# Patient Record
Sex: Female | Born: 1955 | Race: White | Hispanic: No | Marital: Married | Smoking: Former smoker
Health system: Southern US, Community
[De-identification: ages and names within clinical notes are randomized; demographics above are authoritative.]

## PROBLEM LIST (undated history)

## (undated) DIAGNOSIS — I10 Essential (primary) hypertension: Secondary | ICD-10-CM

## (undated) DIAGNOSIS — R0609 Other forms of dyspnea: Secondary | ICD-10-CM

## (undated) DIAGNOSIS — I739 Peripheral vascular disease, unspecified: Secondary | ICD-10-CM

## (undated) DIAGNOSIS — J449 Chronic obstructive pulmonary disease, unspecified: Secondary | ICD-10-CM

## (undated) HISTORY — DX: Chronic obstructive pulmonary disease, unspecified: J44.9

## (undated) HISTORY — DX: Peripheral vascular disease, unspecified: I73.9

## (undated) HISTORY — DX: Other forms of dyspnea: R06.09

## (undated) HISTORY — PX: ABDOMINAL HYSTERECTOMY: SHX81

---

## 2003-04-08 ENCOUNTER — Ambulatory Visit (HOSPITAL_COMMUNITY): Admission: RE | Admit: 2003-04-08 | Discharge: 2003-04-08 | Payer: Self-pay | Admitting: Family Medicine

## 2003-07-22 ENCOUNTER — Ambulatory Visit (HOSPITAL_BASED_OUTPATIENT_CLINIC_OR_DEPARTMENT_OTHER): Admission: RE | Admit: 2003-07-22 | Discharge: 2003-07-22 | Payer: Self-pay | Admitting: Urology

## 2007-09-01 ENCOUNTER — Ambulatory Visit (HOSPITAL_COMMUNITY): Admission: RE | Admit: 2007-09-01 | Discharge: 2007-09-01 | Payer: Self-pay | Admitting: Internal Medicine

## 2007-09-08 ENCOUNTER — Ambulatory Visit (HOSPITAL_COMMUNITY): Admission: RE | Admit: 2007-09-08 | Discharge: 2007-09-08 | Payer: Self-pay | Admitting: Internal Medicine

## 2010-07-13 NOTE — Op Note (Signed)
NAME:  Brenda Goodwin, Brenda Goodwin                           ACCOUNT NO.:  [id number]   MEDICAL RECORD NO.:  [id number]                   PATIENT TYPE:  AMB   LOCATION:  [facility]                                 FACILITY:  [facility]   PHYSICIAN:  Ronald L. Earlene Plater, M.D.               DATE OF BIRTH:  1956/02/08   DATE OF PROCEDURE:  07/22/2003  DATE OF DISCHARGE:                                 OPERATIVE REPORT   PREOPERATIVE DIAGNOSIS:  Stress urinary incontinence.   POSTOPERATIVE DIAGNOSIS:  Stress urinary incontinence.   PROCEDURE:  1. Cystoscopy.  2. Transvaginal tape sling (TVT).   SURGEON:  Darvin Neighbours, MD   RESIDENT SURGEON:  Rhae Lerner, MD   ANESTHESIA:  General endotracheal anesthesia.   COMPLICATIONS:  None.   INDICATIONS FOR PROCEDURE:  [name] is a 55 year old female with a history  of stress urinary incontinence, demonstrated by her history as well as  urodynamic studies.  After discussing the alternatives for treatment of her  incontinence, the patient has elected to undergo TVT.   DESCRIPTION OF THE PROCEDURE IN DETAIL:  The patient was brought to the  operating room and following induction of anesthesia, was placed in the  dorsal lithotomy position and prepped and draped in the usual sterile  fashion.  A Foley catheter was subsequently placed to straight drainage.  Marcaine 0.25% 7 mL were injected superficially into the anterior vaginal  wall.  A midline incision approximately 2 cm in length was then made on the  anterior vaginal wall midway between the urethral meatus and the  ureterovesical junction.  Dissection was carried out to develop a plane  bilaterally between the anterior vaginal wall and the periurethral and  pubocervical fascia.  The dissection was carried laterally all the way to  the lateral edge of the pubocervical fascia, just posterior to the pubic  bone.  Once this had been accomplished, a small incision was made  superolaterally on the left and right  to the pubic symphysis.  A guide was  subsequently passed down along the pubic bone on either side through these  incisions and brought through the fascia immediately lateral to the  ureterovesical junction on either side.  A rigid cystoscope was subsequently  passed into the patient's bladder after removing the Foley catheter to check  for bladder injury, and examination of the bladder revealed no perforation.  The guides were subsequently used to pass a transvaginal tape sling up  through the endopelvic fascia and into position.  Correct positioning of the  sling was confirmed by passing a hemostat between the sling and the urethra.  A second inspection of the bladder mucosa was performed, again revealing no  injury.  The transvaginal tape sling was subsequently deployed and its  correct position again checked and confirmed.  A final inspection was made  of the bladder mucosa using the rigid cystoscope, again revealing that the  bladder mucosa was intact with no signs of any injury.  The bladder was  subsequently filled and the cystoscope removed.  Suprapubic pressure was  subsequently applied to confirm that fluid could pass through the urethra.  The bladder was subsequently drained and attention returned to the vaginal  incision.  The incision was closed with a running 2-0 Vicryl suture.  The  sling was subsequently trimmed suprapubically, and the small suprapubic  incisions  closed with Dermabond.  Vaginal packing was placed, and the case was ended.  The patient tolerated the procedure well; there were no complications.  Please note that Dr. Darvin Neighbours was present for the entire case and  participated in all aspects of the procedure.     Bailey Mech, MD                        Lucrezia Starch. Earlene Plater, M.D.    JP/MEDQ  D:  07/22/2003  T:  07/23/2003  Job:  [id number]

## 2018-05-18 ENCOUNTER — Observation Stay (HOSPITAL_BASED_OUTPATIENT_CLINIC_OR_DEPARTMENT_OTHER): Payer: Self-pay

## 2018-05-18 ENCOUNTER — Encounter: Payer: Self-pay | Admitting: Emergency Medicine

## 2018-05-18 ENCOUNTER — Emergency Department (HOSPITAL_COMMUNITY): Payer: Self-pay

## 2018-05-18 ENCOUNTER — Observation Stay (HOSPITAL_COMMUNITY): Payer: Self-pay

## 2018-05-18 ENCOUNTER — Inpatient Hospital Stay (HOSPITAL_COMMUNITY)
Admission: EM | Admit: 2018-05-18 | Discharge: 2018-05-20 | DRG: 280 | Disposition: A | Discharge status: Home / Self Care | Payer: Self-pay | Attending: Internal Medicine | Admitting: Internal Medicine

## 2018-05-18 ENCOUNTER — Other Ambulatory Visit: Payer: Self-pay

## 2018-05-18 DIAGNOSIS — M7989 Other specified soft tissue disorders: Secondary | ICD-10-CM

## 2018-05-18 DIAGNOSIS — Z79899 Other long term (current) drug therapy: Secondary | ICD-10-CM

## 2018-05-18 DIAGNOSIS — R42 Dizziness and giddiness: Secondary | ICD-10-CM | POA: Diagnosis present

## 2018-05-18 DIAGNOSIS — R0981 Nasal congestion: Secondary | ICD-10-CM

## 2018-05-18 DIAGNOSIS — Z791 Long term (current) use of non-steroidal anti-inflammatories (NSAID): Secondary | ICD-10-CM

## 2018-05-18 DIAGNOSIS — R634 Abnormal weight loss: Secondary | ICD-10-CM | POA: Diagnosis present

## 2018-05-18 DIAGNOSIS — Z90710 Acquired absence of both cervix and uterus: Secondary | ICD-10-CM

## 2018-05-18 DIAGNOSIS — Z833 Family history of diabetes mellitus: Secondary | ICD-10-CM

## 2018-05-18 DIAGNOSIS — R55 Syncope and collapse: Secondary | ICD-10-CM

## 2018-05-18 DIAGNOSIS — I2510 Atherosclerotic heart disease of native coronary artery without angina pectoris: Secondary | ICD-10-CM

## 2018-05-18 DIAGNOSIS — J189 Pneumonia, unspecified organism: Secondary | ICD-10-CM | POA: Diagnosis present

## 2018-05-18 DIAGNOSIS — E872 Acidosis: Secondary | ICD-10-CM | POA: Diagnosis present

## 2018-05-18 DIAGNOSIS — R05 Cough: Secondary | ICD-10-CM

## 2018-05-18 DIAGNOSIS — R7303 Prediabetes: Secondary | ICD-10-CM | POA: Diagnosis present

## 2018-05-18 DIAGNOSIS — R32 Unspecified urinary incontinence: Secondary | ICD-10-CM

## 2018-05-18 DIAGNOSIS — Z825 Family history of asthma and other chronic lower respiratory diseases: Secondary | ICD-10-CM

## 2018-05-18 DIAGNOSIS — E876 Hypokalemia: Secondary | ICD-10-CM | POA: Diagnosis present

## 2018-05-18 DIAGNOSIS — R0902 Hypoxemia: Secondary | ICD-10-CM | POA: Diagnosis present

## 2018-05-18 DIAGNOSIS — R918 Other nonspecific abnormal finding of lung field: Secondary | ICD-10-CM

## 2018-05-18 DIAGNOSIS — J181 Lobar pneumonia, unspecified organism: Secondary | ICD-10-CM

## 2018-05-18 DIAGNOSIS — I214 Non-ST elevation (NSTEMI) myocardial infarction: Principal | ICD-10-CM | POA: Diagnosis present

## 2018-05-18 DIAGNOSIS — W19XXXA Unspecified fall, initial encounter: Secondary | ICD-10-CM

## 2018-05-18 DIAGNOSIS — R7989 Other specified abnormal findings of blood chemistry: Secondary | ICD-10-CM

## 2018-05-18 DIAGNOSIS — I10 Essential (primary) hypertension: Secondary | ICD-10-CM | POA: Diagnosis present

## 2018-05-18 DIAGNOSIS — F17210 Nicotine dependence, cigarettes, uncomplicated: Secondary | ICD-10-CM | POA: Diagnosis present

## 2018-05-18 DIAGNOSIS — E785 Hyperlipidemia, unspecified: Secondary | ICD-10-CM | POA: Diagnosis present

## 2018-05-18 DIAGNOSIS — J440 Chronic obstructive pulmonary disease with acute lower respiratory infection: Secondary | ICD-10-CM | POA: Diagnosis present

## 2018-05-18 HISTORY — DX: Essential (primary) hypertension: I10

## 2018-05-18 LAB — CBC WITH DIFFERENTIAL/PLATELET
Abs Immature Granulocytes: 0.09 10*3/uL — ABNORMAL HIGH (ref 0.00–0.07)
BASOS PCT: 1 %
Basophils Absolute: 0.1 10*3/uL (ref 0.0–0.1)
EOS ABS: 0.2 10*3/uL (ref 0.0–0.5)
Eosinophils Relative: 2 %
HCT: 42.7 % (ref 36.0–46.0)
Hemoglobin: 13.9 g/dL (ref 12.0–15.0)
Immature Granulocytes: 1 %
Lymphocytes Relative: 24 %
Lymphs Abs: 3.1 10*3/uL (ref 0.7–4.0)
MCH: 27.8 pg (ref 26.0–34.0)
MCHC: 32.6 g/dL (ref 30.0–36.0)
MCV: 85.4 fL (ref 80.0–100.0)
MONOS PCT: 4 %
Monocytes Absolute: 0.5 10*3/uL (ref 0.1–1.0)
NEUTROS PCT: 68 %
Neutro Abs: 8.9 10*3/uL — ABNORMAL HIGH (ref 1.7–7.7)
Platelets: 202 10*3/uL (ref 150–400)
RBC: 5 MIL/uL (ref 3.87–5.11)
RDW: 12 % (ref 11.5–15.5)
WBC: 12.8 10*3/uL — ABNORMAL HIGH (ref 4.0–10.5)
nRBC: 0 % (ref 0.0–0.2)

## 2018-05-18 LAB — ECHOCARDIOGRAM COMPLETE
Height: 67 in
Weight: 2720 oz

## 2018-05-18 LAB — COMPREHENSIVE METABOLIC PANEL
ALT: 16 U/L (ref 0–44)
ANION GAP: 15 (ref 5–15)
AST: 32 U/L (ref 15–41)
Albumin: 3.5 g/dL (ref 3.5–5.0)
Alkaline Phosphatase: 76 U/L (ref 38–126)
BUN: 12 mg/dL (ref 8–23)
CO2: 18 mmol/L — ABNORMAL LOW (ref 22–32)
Calcium: 9.1 mg/dL (ref 8.9–10.3)
Chloride: 104 mmol/L (ref 98–111)
Creatinine, Ser: 1.2 mg/dL — ABNORMAL HIGH (ref 0.44–1.00)
GFR, EST AFRICAN AMERICAN: 56 mL/min — AB (ref >60)
GFR, EST NON AFRICAN AMERICAN: 48 mL/min — AB (ref >60)
Glucose, Bld: 151 mg/dL — ABNORMAL HIGH (ref 70–99)
POTASSIUM: 3.2 mmol/L — AB (ref 3.5–5.1)
Sodium: 137 mmol/L (ref 135–145)
Total Bilirubin: 1.6 mg/dL — ABNORMAL HIGH (ref 0.3–1.2)
Total Protein: 6.5 g/dL (ref 6.5–8.1)

## 2018-05-18 LAB — TROPONIN I: TROPONIN I: 0.04 ng/mL — AB (ref <0.03)

## 2018-05-18 LAB — HEMOGLOBIN A1C
Hgb A1c MFr Bld: 5.9 % — ABNORMAL HIGH (ref 4.8–5.6)
Mean Plasma Glucose: 122.63 mg/dL

## 2018-05-18 LAB — LIPID PANEL
Cholesterol: 165 mg/dL (ref 0–200)
HDL: 34 mg/dL — ABNORMAL LOW (ref >40)
LDL CALC: 109 mg/dL — AB (ref 0–99)
Total CHOL/HDL Ratio: 4.9 RATIO
Triglycerides: 108 mg/dL (ref <150)
VLDL: 22 mg/dL (ref 0–40)

## 2018-05-18 LAB — MAGNESIUM: Magnesium: 1.5 mg/dL — ABNORMAL LOW (ref 1.7–2.4)

## 2018-05-18 LAB — TSH: TSH: 1.665 u[IU]/mL (ref 0.350–4.500)

## 2018-05-18 MED ORDER — ASPIRIN EC 81 MG PO TBEC
81.0000 mg | DELAYED_RELEASE_TABLET | Freq: Every day | ORAL | Status: DC
  Administered 2018-05-18 – 2018-05-20 (×3): 81 mg via ORAL
  Filled 2018-05-18 (×3): qty 1

## 2018-05-18 MED ORDER — ACETAMINOPHEN 650 MG RE SUPP: 650.0000 mg | Freq: Four times a day (QID) | RECTAL | Status: DC | PRN

## 2018-05-18 MED ORDER — POTASSIUM CHLORIDE CRYS ER 20 MEQ PO TBCR
30.0000 meq | EXTENDED_RELEASE_TABLET | ORAL | Status: AC
  Administered 2018-05-18 (×2): 30 meq via ORAL
  Filled 2018-05-18 (×2): qty 1

## 2018-05-18 MED ORDER — SODIUM CHLORIDE 0.9 % IV SOLN
INTRAVENOUS | Status: DC
  Administered 2018-05-18: 10:00:00 via INTRAVENOUS

## 2018-05-18 MED ORDER — SODIUM CHLORIDE 0.9 % IV SOLN
1.0000 g | Freq: Once | INTRAVENOUS | Status: AC
  Administered 2018-05-18: 1 g via INTRAVENOUS
  Filled 2018-05-18: qty 10

## 2018-05-18 MED ORDER — SODIUM CHLORIDE 0.9% FLUSH
3.0000 mL | Freq: Two times a day (BID) | INTRAVENOUS | Status: DC
  Administered 2018-05-18: 3 mL via INTRAVENOUS

## 2018-05-18 MED ORDER — SODIUM CHLORIDE 0.9 % IV BOLUS
500.0000 mL | Freq: Once | INTRAVENOUS | Status: AC
  Administered 2018-05-18: 500 mL via INTRAVENOUS

## 2018-05-18 MED ORDER — ONDANSETRON HCL 4 MG PO TABS
4.0000 mg | ORAL_TABLET | Freq: Four times a day (QID) | ORAL | Status: DC | PRN
  Administered 2018-05-20: 4 mg via ORAL
  Filled 2018-05-18: qty 1

## 2018-05-18 MED ORDER — ONDANSETRON HCL 4 MG/2ML IJ SOLN: 4.0000 mg | Freq: Four times a day (QID) | INTRAMUSCULAR | Status: DC | PRN

## 2018-05-18 MED ORDER — ACETAMINOPHEN 325 MG PO TABS
650.0000 mg | ORAL_TABLET | Freq: Four times a day (QID) | ORAL | Status: DC | PRN
  Administered 2018-05-20: 650 mg via ORAL
  Filled 2018-05-18: qty 2

## 2018-05-18 MED ORDER — ENOXAPARIN SODIUM 40 MG/0.4ML ~~LOC~~ SOLN
40.0000 mg | Freq: Every day | SUBCUTANEOUS | Status: DC
  Administered 2018-05-18 – 2018-05-19 (×2): 40 mg via SUBCUTANEOUS
  Filled 2018-05-18 (×2): qty 0.4

## 2018-05-18 MED ORDER — SODIUM CHLORIDE 0.9 % IV SOLN
500.0000 mg | Freq: Once | INTRAVENOUS | Status: AC
  Administered 2018-05-18: 500 mg via INTRAVENOUS
  Filled 2018-05-18: qty 500

## 2018-05-18 MED ORDER — POLYETHYLENE GLYCOL 3350 17 G PO PACK: 17.0000 g | PACK | Freq: Every day | ORAL | Status: DC | PRN

## 2018-05-18 NOTE — Progress Notes (Signed)
  Echocardiogram 2D Echocardiogram has been performed.  Brenda Goodwin 05/18/2018, 12:52 PM

## 2018-05-18 NOTE — ED Provider Notes (Addendum)
[facility] EMERGENCY DEPARTMENT Provider Note   CSN: [id number] Arrival date & time: 05/18/18  3748    History   Chief Complaint No chief complaint on file.   HPI Brenda Goodwin is a 63 y.o. female.     Patient brought in by EMS.  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  Felt dizzy and passed out hit head on the grill.  No obvious injury.  Patient alert and oriented.  Patient states she felt fine this morning felt fine yesterday.  She has had a dry cough for a period of time.  No recent respiratory symptoms.  No fevers.  No known coronavirus contacts.  Patient does still occasionally cough up some yellow phlegm.  Patient was on some antibiotics back in December.  No recent admissions to the hospital.  According to EMS patient vomited 3-4 times.  EMS did give her Zofran.  Afebrile upon arrival.  Shortly after arrival here patient dropped her oxygen saturations down into the 80s on room air and was placed on 3 L.  Patient sats went up to around 97 on that.  Patient oxygen sent decreased down to 2 L.  Patient satting in the 90s.  Patient denies any pain or chest pain.  No history of syncope.     Past Medical History:  Diagnosis Date  . Hypertension     There are no active problems to display for this patient.   Past Surgical History:  Procedure Laterality Date  . ABDOMINAL HYSTERECTOMY       OB History   No obstetric history on file.      Home Medications    Prior to Admission medications   Not on File    Family History History reviewed. No pertinent family history.  Social History Social History   Tobacco Use  . Smoking status: Current Every Day Smoker    Packs/day: 1.00    Types: Cigarettes  . Smokeless tobacco: Never Used  Substance Use Topics  . Alcohol use: Not Currently  . Drug use: Never     Allergies   Patient has no known allergies.   Review of Systems Review of Systems  Constitutional: Negative for chills and fever.  HENT:  Negative for congestion, rhinorrhea and sore throat.   Eyes: Negative for visual disturbance.  Respiratory: Positive for cough. Negative for shortness of breath.   Cardiovascular: Negative for chest pain and leg swelling.  Gastrointestinal: Positive for nausea and vomiting. Negative for abdominal pain and diarrhea.  Genitourinary: Negative for dysuria.  Musculoskeletal: Negative for back pain and neck pain.  Skin: Negative for rash.  Neurological: Positive for dizziness and syncope. Negative for light-headedness and headaches.  Hematological: Does not bruise/bleed easily.  Psychiatric/Behavioral: Negative for confusion.     Physical Exam Updated Vital Signs BP 137/68   Pulse 70   Temp 97.8 F (36.6 C) (Oral)   Resp 13   Ht 1.702 m (5\' 7" )   Wt 77.1 kg   SpO2 (!) 87%   BMI 26.63 kg/m   Physical Exam Vitals signs and nursing note reviewed.  Constitutional:      General: She is not in acute distress.    Appearance: Normal appearance. She is well-developed.  HENT:     Head: Normocephalic and atraumatic.     Nose: No congestion.     Mouth/Throat:     Mouth: Mucous membranes are moist.  Eyes:     Extraocular Movements: Extraocular movements intact.  Conjunctiva/sclera: Conjunctivae normal.     Pupils: Pupils are equal, round, and reactive to light.  Neck:     Musculoskeletal: Normal range of motion and neck supple.  Cardiovascular:     Rate and Rhythm: Normal rate and regular rhythm.     Heart sounds: No murmur.  Pulmonary:     Effort: Pulmonary effort is normal. No respiratory distress.     Breath sounds: Normal breath sounds. No wheezing, rhonchi or rales.  Chest:     Chest wall: No tenderness.  Abdominal:     Palpations: Abdomen is soft.     Tenderness: There is no abdominal tenderness.  Musculoskeletal: Normal range of motion.  Skin:    General: Skin is warm and dry.     Capillary Refill: Capillary refill takes less than 2 seconds.  Neurological:      General: No focal deficit present.     Mental Status: She is alert and oriented to person, place, and time.      ED Treatments / Results  Labs (all labs ordered are listed, but only abnormal results are displayed) Labs Reviewed  CBC WITH DIFFERENTIAL/PLATELET - Abnormal; Notable for the following components:      Result Value   WBC 12.8 (*)    Neutro Abs 8.9 (*)    Abs Immature Granulocytes 0.09 (*)    All other components within normal limits  COMPREHENSIVE METABOLIC PANEL - Abnormal; Notable for the following components:   Potassium 3.2 (*)    CO2 18 (*)    Glucose, Bld 151 (*)    Creatinine, Ser 1.20 (*)    Total Bilirubin 1.6 (*)    GFR calc non Af Amer 48 (*)    GFR calc Af Amer 56 (*)    All other components within normal limits    EKG EKG Interpretation  Date/Time:  Monday May 18 2018 08:03:49 EDT Ventricular Rate:  72 PR Interval:    QRS Duration: 112 QT Interval:  377 QTC Calculation: 413 R Axis:   14 Text Interpretation:  Sinus rhythm Short PR interval Borderline intraventricular conduction delay Low voltage, precordial leads Borderline repolarization abnormality No previous ECGs available Artifact Confirmed by Vanetta Mulders [phone]) on 05/18/2018 8:20:59 AM   Radiology Dg Chest 2 View  Result Date: 05/18/2018 CLINICAL DATA:  Productive cough for 2 months EXAM: CHEST - 2 VIEW COMPARISON:  09/01/2007 FINDINGS: Cardiac shadow is within normal limits. The lungs are well aerated bilaterally. Patchy right middle lobe opacity is noted. No sizable effusion is seen. No bony abnormality is noted. IMPRESSION: Right middle lobe mild infiltrate. Electronically Signed   By: Alcide Clever M.D.   On: 05/18/2018 09:16    Procedures Procedures (including critical care time)  Medications Ordered in ED Medications  0.9 %  sodium chloride infusion ( Intravenous New Bag/Given 05/18/18 0948)  cefTRIAXone (ROCEPHIN) 1 g in sodium chloride 0.9 % 100 mL IVPB (has no  administration in time range)  azithromycin (ZITHROMAX) 500 mg in sodium chloride 0.9 % 250 mL IVPB (has no administration in time range)  sodium chloride 0.9 % bolus 500 mL (500 mLs Intravenous New Bag/Given 05/18/18 0850)     Initial Impression / Assessment and Plan / ED Course  I have reviewed the triage vital signs and the nursing notes.  Pertinent labs & imaging results that were available during my care of the patient were reviewed by me and considered in my medical decision making (see chart for details).  Patient with a syncopal episode at work.  Patient with no known coronavirus exposures or travel.  Patient without fevers.  Patient felt fine yesterday felt fine this morning has had a little bit of a dry cough occasionally productive now for a long period of time.  Patient was at work got very dizzy and lightheaded and then passed out did hit her head on the grill but no injuries.  Patient became hypoxic shortly after arrival here placed on 3 L of nasal cannula oxygen fine.  Chest x-ray is consistent with a right middle lobe pneumonia.  Cardiac monitoring without arrhythmia.  Labs without significant abnormalities.  Other than bilirubin slightly elevated white blood cell count slightly elevated.  No concerns for coronavirus either based on this presentation or any of her recent history.   Final Clinical Impressions(s) / ED Diagnoses   Final diagnoses:  Community acquired pneumonia of right middle lobe of lung (HCC)  Syncope, unspecified syncope type  Hypoxia    ED Discharge Orders    None       Vanetta Mulders, MD 05/18/18 1011    Vanetta Mulders, MD 05/18/18 1031

## 2018-05-18 NOTE — Progress Notes (Signed)
CRITICAL VALUE ALERT  Critical Value:  Troponin 0.04  Date & Time Notied:  05/18/18 - 1200  Provider Notified: Yes  Orders Received/Actions taken: Awaiting call back

## 2018-05-18 NOTE — ED Triage Notes (Signed)
Pt arrives via EMS. 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 Hit her head on a grill. No obvious injury. Pt alert and oriented for EMS. Pt feeling dizzy. EMS gave 4mg  zofran. Pt pale/diaphoretic and vomiting 3-4 times. Denies CP. BP 120/70, HR 70, CBG 145

## 2018-05-18 NOTE — H&P (Signed)
Date: 05/18/2018               Patient Name:  Brenda Goodwin MRN: [id number]  DOB: 1955-03-12 Age / Sex: 63 y.o., female   PCP: Patient, No Pcp Per         Medical Service: Internal Medicine Teaching Service         Attending Physician: Dr. Sandre Kitty, Elwin Mocha, MD    First Contact: Dr. Gwyneth Revels Pager: [phone]  Second Contact: Dr. Antony Contras Pager: [phone]       After Hours (After 5p/  First Contact Pager: [phone]  weekends / holidays): Second Contact Pager: [phone]   Chief Complaint: Syncope  History of Present Illness: Brenda Goodwin is a 63 y.o. female with PMHx significant for hypertension brought to ED via EMS after having a syncopal episode this morning.  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  She does not recall hitting her head and denies any headache or blurry vision.  She denies any involuntary movements or incontinence, no tongue bite.  On regaining consciousness she was feeling little short of breath and nauseated, had 2-3 emesis and ambulance.  Denies any chest pain, orthopnea or PND. Patient is having a persistent cough currently with clear mucus since December 2019, once treated with antibiotic in December.  She does not take anything for her cough at this time.  She do have mild nasal congestion and postnasal drip since December.  She denies any fever or chills.  Denies any recent exacerbation of her symptoms.  Denies any sick contact or recent travel.  No recent immobilization.  She denies any recent change in her appetite or bowel habits.  Per patient she is having unintentional weight loss where she lost about 20 to 30 pounds over the past 1 year, she thinks that is because of the death of her granddaughter about a year ago. Patient has a longstanding  history of urinary incontinence, uses incontinent pads, head is sling placed approximately 8 to 10 years ago, used to take oxybutynin which she stopped 2 years ago due to excessively dry mouth. She denies any recent change in her urine urinary symptoms. Her only medicine is HCTZ which she takes regularly.  ED course.  On presentation patient was afebrile, normotensive, initially saturating well on room air, later become hypoxic to high 80s requiring 2 L of oxygen.  Labs with mild neutrophilic predominant leukocytosis, potassium of 3.2, bicarb of 18, creatinine of 1.2 and total bilirubin of 1.6.  Chest x-ray with possible right middle lobe infiltrate-treated with ceftriaxone and azithromycin in ED.  Meds:  Current Meds  Medication Sig  . hydrochlorothiazide (MICROZIDE) 12.5 MG capsule Take 12.5 mg by mouth daily.   Allergies: Allergies as of 05/18/2018  . (No Known Allergies)   Past Medical History:  Diagnosis Date  . Hypertension     Family History: Brother with DM and died in early 8es due to complications. Father has an history of emphysema.  Social History: Lives with her husband, 2 grownup kids.  Works as a Financial risk analyst at a [employer].  Lifetime smoker of 1 pack/day.  Denies any alcohol or illicit drug use.  Review of Systems: A complete ROS was negative except as per HPI.  Physical Exam: Blood pressure 121/63, pulse 72, temperature 97.8 F (36.6 C), temperature source Oral, resp. rate 12, height  (1.702 m), weight 77.1 kg, SpO2 99 %. Vitals:   05/18/18 1115 05/18/18 1130 05/18/18 1208 05/18/18 1227  BP: 127/62 121/62 (!) 108/59   Pulse: 75 73 71   Resp: 17 (!) 9 18   Temp:   97.8 F (36.6 C)   TempSrc:   Oral   SpO2: 99% 100% 100% 95%  Weight:      Height:       General: Vital signs reviewed.  Patient is well-developed and well-nourished, in no acute distress and cooperative with exam. Mucous membranes appear dry. Head: Normocephalic and atraumatic. Eyes: EOMI,  conjunctivae normal, no scleral icterus.  Neck: Supple, trachea midline, normal ROM, no JVD, masses, thyromegaly, or carotid bruit present.  Cardiovascular: RRR, S1 normal, S2 normal, no murmurs, gallops, or rubs. Pulmonary/Chest: Clear to auscultation bilaterally, no wheezes, rales, or rhonchi. Abdominal: Soft, non-tender, non-distended, BS +, no masses, organomegaly, or guarding present.  Extremities: No lower extremity edema bilaterally,  pulses symmetric and intact bilaterally. No cyanosis or clubbing. Neurological: A&O x3, Strength is normal and symmetric bilaterally, cranial nerve II-XII are grossly intact, no focal motor deficit, sensory intact to light touch bilaterally.  Skin: Warm, dry and intact. No rashes or erythema. Psychiatric: Normal mood and affect. speech and behavior is normal. Cognition and memory are normal.  EKG: personally reviewed my interpretation is ST depression in inferolateral leads, no prior EKG to compare.  CXR: personally reviewed my interpretation is possible right middle lobe infiltrate??  Assessment & Plan by Problem: Brenda Goodwin is a 63 y.o. female with PMHx significant for hypertension brought to ED via EMS after having a syncopal episode this morning.  Active Problems:   Syncope and collapse Her description is more consistent with vasovagal/cardiagenic.  No sign of seizure.  Not much explanation for hypoxia, her PERC score was 2 because of her age and hypoxia but Wells score score and Geneva was 0 so PE is unlikely.  EKG with ST depression in inferolateral leads, no prior EKG to compare with.  Troponin positive at 0.04, no chest pain. Might be due to dehydration as patient is on HCTZ and her mucous membranes appear dry. Lipid panel with ASCVD risk of 14.8%, patient does not want to be on statin as  according to her research statin does not help. -Admit to telemetry. -Check orthostatic vitals. -Trend troponin. -Echocardiogram. -Start her on aspirin 81  mg daily. -Needs counseling for statin.  Possible right middle lobe infiltrate.  She got 1 dose of ceftriaxone and azithromycin in ED for CAP coverage.  Doubt it is pneumonia. Patient is having persistent cough since December, no recent change or any new upper respiratory symptoms.  Remained afebrile. Had a lifelong history of smoking and unintentional weight loss over 1 year. -Getting CT chest for chronic cough work-up.  Non-anion gap metabolic acidosis with elevated creatinine and T bili. Most likely due to hypoxia.  Per patient she was told in January by her PCP that her kidney functions are little off and she needs a repeat testing done in July.  No baseline to compare. -Monitor renal function. -Avoid nephrotoxic.  Hypokalemia.  Potassium at 3.2. -Checking magnesium. -Repleting potassium.  Hypertension.  Currently normotensive, had her morning dose of HCTZ today. Holding HCTZ and checking orthostatic vitals, can be restarted tomorrow.  Prediabetes.  A1c done due to glucose of 151, it was 5.9,  in prediabetes range. -Patient needs lifestyle modification counseling.  DVT Prophylaxis. Lovenox Code Status. Full Diet. Heart healthy  Dispo: Admit patient to Observation with expected length of stay less than 2 midnights.  SignedArnetha Courser, MD 05/18/2018, 11:04 AM  Pager: [phone]

## 2018-05-18 NOTE — ED Notes (Signed)
Pt's O2 sats on room air 85%, placed pt on 3L [place] sats improved to 94%. Pt denies CP/SOB.

## 2018-05-18 NOTE — ED Notes (Signed)
Pt states she has not been running fevers, but has had a cough since January. Pt states she was cougging up yellow phlegm. Now it is a dry cough. Her MD told her she had a viral infection. Has not been on antibiotics. Pt states over the past weekend she felt the best she has felt in a while.

## 2018-05-19 ENCOUNTER — Observation Stay (HOSPITAL_COMMUNITY): Payer: Self-pay

## 2018-05-19 DIAGNOSIS — F17200 Nicotine dependence, unspecified, uncomplicated: Secondary | ICD-10-CM

## 2018-05-19 DIAGNOSIS — R55 Syncope and collapse: Secondary | ICD-10-CM

## 2018-05-19 DIAGNOSIS — I82409 Acute embolism and thrombosis of unspecified deep veins of unspecified lower extremity: Secondary | ICD-10-CM

## 2018-05-19 DIAGNOSIS — E7800 Pure hypercholesterolemia, unspecified: Secondary | ICD-10-CM

## 2018-05-19 DIAGNOSIS — I214 Non-ST elevation (NSTEMI) myocardial infarction: Principal | ICD-10-CM

## 2018-05-19 DIAGNOSIS — M79641 Pain in right hand: Secondary | ICD-10-CM

## 2018-05-19 LAB — COMPREHENSIVE METABOLIC PANEL
ALK PHOS: 60 U/L (ref 38–126)
ALT: 11 U/L (ref 0–44)
AST: 17 U/L (ref 15–41)
Albumin: 3 g/dL — ABNORMAL LOW (ref 3.5–5.0)
Anion gap: 10 (ref 5–15)
BILIRUBIN TOTAL: 0.8 mg/dL (ref 0.3–1.2)
BUN: 9 mg/dL (ref 8–23)
CO2: 24 mmol/L (ref 22–32)
Calcium: 8.9 mg/dL (ref 8.9–10.3)
Chloride: 107 mmol/L (ref 98–111)
Creatinine, Ser: 1.02 mg/dL — ABNORMAL HIGH (ref 0.44–1.00)
GFR calc Af Amer: >60 mL/min (ref >60)
GFR, EST NON AFRICAN AMERICAN: 58 mL/min — AB (ref >60)
Glucose, Bld: 103 mg/dL — ABNORMAL HIGH (ref 70–99)
Potassium: 3.8 mmol/L (ref 3.5–5.1)
Sodium: 141 mmol/L (ref 135–145)
Total Protein: 5.6 g/dL — ABNORMAL LOW (ref 6.5–8.1)

## 2018-05-19 LAB — CBC
HCT: 37.1 % (ref 36.0–46.0)
Hemoglobin: 11.8 g/dL — ABNORMAL LOW (ref 12.0–15.0)
MCH: 27.4 pg (ref 26.0–34.0)
MCHC: 31.8 g/dL (ref 30.0–36.0)
MCV: 86.1 fL (ref 80.0–100.0)
Platelets: 180 10*3/uL (ref 150–400)
RBC: 4.31 MIL/uL (ref 3.87–5.11)
RDW: 12.2 % (ref 11.5–15.5)
WBC: 8.3 10*3/uL (ref 4.0–10.5)
nRBC: 0 % (ref 0.0–0.2)

## 2018-05-19 LAB — D-DIMER, QUANTITATIVE: D-Dimer, Quant: 0.91 ug/mL-FEU — ABNORMAL HIGH (ref 0.00–0.50)

## 2018-05-19 LAB — TROPONIN I
TROPONIN I: 0.13 ng/mL — AB (ref <0.03)
Troponin I: 0.08 ng/mL (ref <0.03)

## 2018-05-19 LAB — HIV ANTIBODY (ROUTINE TESTING W REFLEX): HIV Screen 4th Generation wRfx: NONREACTIVE

## 2018-05-19 LAB — PROCALCITONIN: Procalcitonin: 0.19 ng/mL

## 2018-05-19 MED ORDER — HYDROXYZINE HCL 25 MG PO TABS
25.0000 mg | ORAL_TABLET | Freq: Four times a day (QID) | ORAL | Status: DC | PRN
  Administered 2018-05-19: 25 mg via ORAL
  Filled 2018-05-19: qty 1

## 2018-05-19 MED ORDER — NICOTINE 14 MG/24HR TD PT24
14.0000 mg | MEDICATED_PATCH | Freq: Every day | TRANSDERMAL | Status: DC
  Administered 2018-05-19 – 2018-05-20 (×2): 14 mg via TRANSDERMAL
  Filled 2018-05-19 (×2): qty 1

## 2018-05-19 MED ORDER — HEPARIN (PORCINE) 25000 UT/250ML-% IV SOLN
1100.0000 [IU]/h | INTRAVENOUS | Status: DC
  Administered 2018-05-19: 1000 [IU]/h via INTRAVENOUS
  Filled 2018-05-19: qty 250

## 2018-05-19 MED ORDER — SODIUM CHLORIDE 0.9% FLUSH
3.0000 mL | Freq: Two times a day (BID) | INTRAVENOUS | Status: DC
  Administered 2018-05-19: 3 mL via INTRAVENOUS

## 2018-05-19 MED ORDER — HEPARIN BOLUS VIA INFUSION
4000.0000 [IU] | Freq: Once | INTRAVENOUS | Status: AC
  Administered 2018-05-19: 4000 [IU] via INTRAVENOUS
  Filled 2018-05-19: qty 4000

## 2018-05-19 MED ORDER — METOPROLOL SUCCINATE ER 25 MG PO TB24
25.0000 mg | ORAL_TABLET | Freq: Every day | ORAL | Status: DC
  Administered 2018-05-19: 25 mg via ORAL
  Filled 2018-05-19 (×2): qty 1

## 2018-05-19 MED ORDER — SODIUM CHLORIDE 0.9 % IV SOLN: 250.0000 mL | INTRAVENOUS | Status: DC | PRN

## 2018-05-19 MED ORDER — SODIUM CHLORIDE 0.9 % WEIGHT BASED INFUSION
3.0000 mL/kg/h | INTRAVENOUS | Status: AC
  Administered 2018-05-20: 3 mL/kg/h via INTRAVENOUS

## 2018-05-19 MED ORDER — UMECLIDINIUM BROMIDE 62.5 MCG/INH IN AEPB
1.0000 | INHALATION_SPRAY | Freq: Every day | RESPIRATORY_TRACT | Status: DC
  Administered 2018-05-20: 1 via RESPIRATORY_TRACT
  Filled 2018-05-19: qty 7

## 2018-05-19 MED ORDER — ATORVASTATIN CALCIUM 80 MG PO TABS
80.0000 mg | ORAL_TABLET | Freq: Every day | ORAL | Status: DC
  Administered 2018-05-19: 80 mg via ORAL
  Filled 2018-05-19: qty 1

## 2018-05-19 MED ORDER — TIOTROPIUM BROMIDE MONOHYDRATE 18 MCG IN CAPS: 18.0000 ug | ORAL_CAPSULE | Freq: Every day | RESPIRATORY_TRACT | Status: DC

## 2018-05-19 MED ORDER — SODIUM CHLORIDE 0.9% FLUSH: 3.0000 mL | INTRAVENOUS | Status: DC | PRN

## 2018-05-19 MED ORDER — SODIUM CHLORIDE 0.9 % WEIGHT BASED INFUSION
1.0000 mL/kg/h | INTRAVENOUS | Status: DC
  Administered 2018-05-20: 1 mL/kg/h via INTRAVENOUS

## 2018-05-19 NOTE — Plan of Care (Signed)
  Problem: Clinical Measurements: Goal: Will remain free from infection Outcome: Progressing   Problem: Elimination: Goal: Will not experience complications related to bowel motility Outcome: Progressing   Problem: Safety: Goal: Ability to remain free from injury will improve Outcome: Progressing   

## 2018-05-19 NOTE — TOC Initial Note (Signed)
Transition of Care [facility]) - Initial/Assessment Note    Patient Details  Name: Brenda Goodwin MRN: [id number] Date of Birth: September 22, 1955  Transition of Care [facility]) CM/SW Contact:    Bess Kinds, RN Phone Number: 05/19/2018, 11:44 AM  Clinical Narrative:                 Spoke with patient at bedside. Patient sitting up in chair. Stated that she she is from [place], [place], but works in [place], [place]. No insurance stating because of her age it is too expensive to get from her employer. Does have PCP, Geoffry Paradise, who she sees every 6 months for wellness checks. States that she has transportation home when ready for discharge. Denies an needs at this time. Canceled [facility] appointment since not needed. CM to follow for transition of care needs.   Expected Discharge Plan: Home/Self Care Barriers to Discharge: No Barriers Identified   Patient Goals and CMS Choice Patient states their goals for this hospitalization and ongoing recovery are:: ready to go home   Choice offered to / list presented to : NA  Expected Discharge Plan and Services Expected Discharge Plan: Home/Self Care In-house Referral: NA Discharge Planning Services: CM Consult Post Acute Care Choice: NA                   DME Arranged: N/A DME Agency: NA HH Arranged: NA HH Agency: NA  Prior Living Arrangements/Services     Patient language and need for interpreter reviewed:: No              Criminal Activity/Legal Involvement Pertinent to Current Situation/Hospitalization: No - Comment as needed  Activities of Daily Living Home Assistive Devices/Equipment: None ADL Screening (condition at time of admission) Patient's cognitive ability adequate to safely complete daily activities?: Yes Is the patient deaf or have difficulty hearing?: No Does the patient have difficulty seeing, even when wearing glasses/contacts?: Yes(see objects up close) Does the patient have difficulty concentrating, remembering, or making  decisions?: No Patient able to express need for assistance with ADLs?: Yes Does the patient have difficulty dressing or bathing?: No Independently performs ADLs?: Yes (appropriate for developmental age) Does the patient have difficulty walking or climbing stairs?: No Weakness of Legs: None Weakness of Arms/Hands: None  Permission Sought/Granted                  Emotional Assessment Appearance:: Appears stated age Attitude/Demeanor/Rapport: Engaged Affect (typically observed): Accepting, Happy, Calm Orientation: : Oriented to Self, Oriented to  Time, Oriented to Place, Oriented to Situation   Psych Involvement: No (comment)  Admission diagnosis:  Hypoxia [R09.02] Syncope, unspecified syncope type [R55] Community acquired pneumonia of right middle lobe of lung (HCC) [J18.1] Patient Active Problem List   Diagnosis Date Noted  . Syncope and collapse 05/18/2018   PCP:  Storm Frisk, MD Pharmacy:   [facility] - [place], [place] - [address] [place] [address] Phone: [phone] Fax: [phone]     Social Determinants of Health (SDOH) Interventions    Readmission Risk Interventions No flowsheet data found.

## 2018-05-19 NOTE — Progress Notes (Signed)
SATURATION QUALIFICATIONS: (This note is used to comply with regulatory documentation for home oxygen)  Patient Saturations on Room Air at Rest = 95 %  Patient Saturations on Room Air while Ambulating = 88 %  Patient Saturations on 2 Liters of oxygen while Ambulating = 100 %  Please briefly explain why patient needs home oxygen: 

## 2018-05-19 NOTE — Plan of Care (Signed)
  Problem: Education: Goal: Knowledge of General Education information will improve Description Including pain rating scale, medication(s)/side effects and non-pharmacologic comfort measures Outcome: Progressing   Problem: Health Behavior/Discharge Planning: Goal: Ability to manage health-related needs will improve Outcome: Progressing   

## 2018-05-19 NOTE — H&P (View-Only) (Signed)
I was able to get in touch with her husband Janaija Pisani and explained the procedure.

## 2018-05-19 NOTE — Progress Notes (Signed)
Lower extremity venous duplex has been completed.   Preliminary results in CV Proc.   Blanch Media 05/19/2018 1:55 PM

## 2018-05-19 NOTE — Progress Notes (Addendum)
Subjective: Sitting in chair out of bed today. Feels well, eager for discharge. Continues to have unchanged chronic cough.   Objective:  Vital signs in last 24 hours: Vitals:   05/19/18 0047 05/19/18 0238 05/19/18 0434 05/19/18 0745  BP: (!) 115/50  (!) 113/47 (!) 131/49  Pulse: 72  66 79  Resp: 18  18 20   Temp: 99.2 F (37.3 C)  99 F (37.2 C) 98.6 F (37 C)  TempSrc: Oral  Oral   SpO2: 92%  92% 94%  Weight:  78.4 kg    Height:       Physical Exam Constitutional: NAD, appears comfortable Cardiovascular: RRR, no murmurs, rubs, or gallops.  Pulmonary/Chest: Prolonged expiratory phase, mild bilateral wheezing  Extremities: Warm and well perfused. No edema.  Psychiatric: Normal mood and affect   Assessment/Plan:  Active Problems:   Syncope and collapse  Syncope: Unclear etiology. 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 She arrives early prior to other staff members to open the kitchen. Yesterday after arrival she had set her purse down when she suddenly felt dizzy and passed out. She denies blurry vision, chest pain, or heart palpitations. She thinks she was down for about 15 minutes before a co worker found her. After regaining consciousness she knew immediately where she was, but did not know how she ended up on the floor. She was admitted for syncope work up. Orthostatic vitals checked on admission were normal. EKG demonstrated some ST depression in the inferolateral leads, no prior tracings for comparison. Troponins peaked at 0.13, down to 0.08 this morning. Echocardiogram was unremarkable, LVEF 50-55%, no valvular disease. Telemetry has shown only NSR. She feels back to her baseline this morning, no further episodes of syncope or dizziness. She has no history of cardiovascular disease or known CAD. Will consult cardiology to see if she needs further work up. No mention of coronary artery calcification on CT chest. She did become hypoxic in he ED to the 80s requiring 2L  supplemental oxygen. Overall my only explanation tying all of this together would be underlying undiagnosed COPD with her chronic cough, smoking history, and syncope possibly from hypoxia. Which may also explain her troponin elevation.  -- Cardiology consult; appreciate recommendations  -- d/c remaining troponins -- Continue telemetry  -- PT evaluation   Chronic Cough: Patient is a chronic current smoker. She developed a cough in December which has persisted. She also endorses some unintentional weight loss of 20 lbs. CXR on admission was questionable for RLL infiltrate. Follow up chest CT was obtained to better evaluate given her smoking history and risk of lung cancer. No clear evidence of malignancy but she does have a small area of interstitial disease in the right middle lobe read as consistent with infection or inflammatory reaction as well as some evidence of scarring. Overall clinical picture does not seem consistent with PNA. She is afebrile without leukocytosis. She likely has underlying COPD given her smoking history and wheezing on exam. She will need outpatient PFTs for further work up and a maintenance inhaler. Will start Spiriva empirically.  Doubt this is PNA but will check a procalcitonin to rule out.  -- Start Spiriva daily  -- Outpatient PFTs -- F/u procalcitonin  -- Repeat CT chest 3-6 months   HX of HTN: Holding home HCTZ   FEN: No fluids, replete lytes prn, HH diet VTE ppx: Lovenox  Code Status: FULL     Dispo: Anticipated discharge in approximately 0-1 day(s).  Reymundo Poll, MD 05/19/2018, 10:05 AM Pager: [phone]

## 2018-05-19 NOTE — Consult Note (Signed)
CARDIOLOGY CONSULT NOTE  Patient ID: Brenda Goodwin MRN: [id number] DOB/AGE: 01-Jul-1955 63 y.o.  Admit date: 05/18/2018 Referring Physician  Reymundo Poll, MD Primary Physician:  Madolyn Frieze, Etta Quill, MD Reason for Consultation  Syncope  HPI: Brenda Goodwin  is a 63 y.o. female  With history of 40+ years of tobacco use disorder, hypertension admitted via emergency room, she went to work as usual and was in the kitchen standing and suddenly started feeling dizzy.  This was followed by syncope, but immediately regained consciousness and was throwing up.  Her colleague saw her and asked if she was well, that whether EMS should be called, patient stated to call EMS.  She also threw up small amounts in the EMS as well.  In the emergency room initially she was found to be hypoxemic, eventually became stable and was admitted for further cardiac work-up and syncope work-up.  She denies chest pain, states that she has been having shortness of breath and dyspnea on exertion for many years, especially last 3 months has been worse due to upper respiratory infection and nasal congestion.  She has also had about 2 rounds of antibiotics.  She still bringing up phlegm sometimes yellowish.  No palpitations, no dizziness or syncope prior to this episode.  There is no family history of sudden cardiac death.  No history of alcohol or illicit drug use.  Past Medical History:  Diagnosis Date  . Hypertension      Past Surgical History:  Procedure Laterality Date  . ABDOMINAL HYSTERECTOMY       History reviewed. No pertinent family history.   Social History: Social History   Socioeconomic History  . Marital status: Married    Spouse name: Not on file  . Number of children: Not on file  . Years of education: Not on file  . Highest education level: Not on file  Occupational History  . Not on file  Social Needs  . Financial resource strain: Not on file  . Food insecurity:    Worry: Not on file     Inability: Not on file  . Transportation needs:    Medical: Not on file    Non-medical: Not on file  Tobacco Use  . Smoking status: Current Every Day Smoker    Packs/day: 1.00    Types: Cigarettes  . Smokeless tobacco: Never Used  Substance and Sexual Activity  . Alcohol use: Not Currently  . Drug use: Never  . Sexual activity: Not on file  Lifestyle  . Physical activity:    Days per week: Not on file    Minutes per session: Not on file  . Stress: Not on file  Relationships  . Social connections:    Talks on phone: Not on file    Gets together: Not on file    Attends religious service: Not on file    Active member of club or organization: Not on file    Attends meetings of clubs or organizations: Not on file    Relationship status: Not on file  . Intimate partner violence:    Fear of current or ex partner: Not on file    Emotionally abused: Not on file    Physically abused: Not on file    Forced sexual activity: Not on file  Other Topics Concern  . Not on file  Social History Narrative  . Not on file     Medications Prior to Admission  Medication Sig Dispense Refill Last Dose  .  cholecalciferol (VITAMIN D3) 25 MCG (1000 UT) tablet Take 1,000 Units by mouth daily.   Past Week at Unknown time  . hydrochlorothiazide (MICROZIDE) 12.5 MG capsule Take 12.5 mg by mouth daily.   05/18/2018 at Unknown time  . ibuprofen (ADVIL,MOTRIN) 200 MG tablet Take 400 mg by mouth every 6 (six) hours as needed for headache or mild pain.   Past Week at Unknown time    Review of Systems  Constitutional: Negative for malaise/fatigue and weight loss.  Respiratory: Positive for cough, sputum production, shortness of breath and wheezing. Negative for hemoptysis.   Cardiovascular: Negative for chest pain, palpitations, claudication, leg swelling and PND.  Gastrointestinal: Negative for abdominal pain, blood in stool, constipation, heartburn and vomiting.  Genitourinary: Negative for dysuria.   Musculoskeletal: Negative for joint pain and myalgias.  Neurological: Negative for dizziness, focal weakness and headaches.  Endo/Heme/Allergies: Does not bruise/bleed easily.  Psychiatric/Behavioral: Negative for depression. The patient is not nervous/anxious.   All other systems reviewed and are negative.   Physical Exam: Blood pressure (!) 122/44, pulse 73, temperature 98.3 F (36.8 C), temperature source Oral, resp. rate 19, height  (1.702 m), weight 78.4 kg, SpO2 98 %.  Physical Exam  Constitutional: No distress.  Moderately built and nourished  HENT:  Head: Atraumatic.  Eyes: Conjunctivae are normal.  Neck: Neck supple. No JVD present. No thyromegaly present.  Cardiovascular: Normal rate, regular rhythm, normal heart sounds and intact distal pulses. Exam reveals no gallop.  No murmur heard. Pulmonary/Chest: Effort normal. No respiratory distress. She has wheezes (Bilateral diffuse, right worse than the left.  Mostly expiratory wheeze.).  Abdominal: Soft. Bowel sounds are normal.  Musculoskeletal: Normal range of motion.        General: No edema.  Neurological: She is alert.  Skin: Skin is warm and dry.  Psychiatric: She has a normal mood and affect.    Labs:  BNP (last 3 results) No results for input(s): BNP in the last 8760 hours.  CMP Latest Ref Rng & Units 05/19/2018 05/18/2018  Glucose 70 - 99 mg/dL 161(W) 960(A)  BUN 8 - 23 mg/dL 9 12  Creatinine 5.40 - 1.00 mg/dL 9.81(X) 9.14(N)  Sodium 135 - 145 mmol/L 141 137  Potassium 3.5 - 5.1 mmol/L 3.8 3.2(L)  Chloride 98 - 111 mmol/L 107 104  CO2 22 - 32 mmol/L 24 18(L)  Calcium 8.9 - 10.3 mg/dL 8.9 9.1  Total Protein 6.5 - 8.1 g/dL 8.2(N) 6.5  Total Bilirubin 0.3 - 1.2 mg/dL 0.8 5.6(O)  Alkaline Phos 38 - 126 U/L 60 76  AST 15 - 41 U/L 17 32  ALT 0 - 44 U/L 11 16     CBC Latest Ref Rng & Units 05/19/2018 05/18/2018  WBC 4.0 - 10.5 K/uL 8.3 12.8(H)  Hemoglobin 12.0 - 15.0 g/dL 11.8(L) 13.9  Hematocrit 36.0  - 46.0 % 37.1 42.7  Platelets 150 - 400 K/uL 180 202      Lipid Panel     Component Value Date/Time   CHOL 165 05/18/2018 1106   TRIG 108 05/18/2018 1106   HDL 34 (L) 05/18/2018 1106   CHOLHDL 4.9 05/18/2018 1106   VLDL 22 05/18/2018 1106   LDLCALC 109 (H) 05/18/2018 1106     BNP (last 3 results) No results for input(s): BNP in the last 8760 hours.   HEMOGLOBIN A1C Lab Results  Component Value Date   HGBA1C 5.9 (H) 05/18/2018   MPG 122.63 05/18/2018    BMP Latest Ref Rng &  Units 05/19/2018 05/18/2018  Glucose 70 - 99 mg/dL 409(W) 119(J)  BUN 8 - 23 mg/dL 9 12  Creatinine 4.78 - 1.00 mg/dL 2.95(A) 2.13(Y)  Sodium 135 - 145 mmol/L 141 137  Potassium 3.5 - 5.1 mmol/L 3.8 3.2(L)  Chloride 98 - 111 mmol/L 107 104  CO2 22 - 32 mmol/L 24 18(L)  Calcium 8.9 - 10.3 mg/dL 8.9 9.1     TSH Recent Labs    05/18/18 1106  TSH 1.665     Radiology: Dg Chest 2 View  Result Date: 05/18/2018 CLINICAL DATA:  Productive cough for 2 months EXAM: CHEST - 2 VIEW COMPARISON:  09/01/2007 FINDINGS: Cardiac shadow is within normal limits. The lungs are well aerated bilaterally. Patchy right middle lobe opacity is noted. No sizable effusion is seen. No bony abnormality is noted. IMPRESSION: Right middle lobe mild infiltrate. Electronically Signed   By: Alcide Clever M.D.   On: 05/18/2018 09:16   Ct Chest Wo Contrast  Result Date: 05/18/2018 CLINICAL DATA:  Cough, ongoing since December. Syncopal episode this morning. EXAM: CT CHEST WITHOUT CONTRAST TECHNIQUE: Multidetector CT imaging of the chest was performed following the standard protocol without IV contrast. COMPARISON:  None. FINDINGS: Cardiovascular: No significant vascular findings. Normal heart size. No pericardial effusion. Mediastinum/Nodes: No enlarged mediastinal or axillary lymph nodes. Thyroid gland, trachea, and esophagus demonstrate no significant findings. Rounded area of fluid density in the anterior mediastinum likely  reflecting superior extent of the superior pericardial recess. Lungs/Pleura: Biapical scarring. 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 Adjacent right middle lobe scarring. Mild lingular scarring. No pleural effusion or pneumothorax. Upper Abdomen: No acute abnormality. Musculoskeletal: No acute osseous abnormality. No aggressive osseous lesion. IMPRESSION: 1. 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 Scarring in the right middle lobe and lingula. Electronically Signed   By: Elige Ko   On: 05/18/2018 13:16   Dg Hand 2 View Right  Result Date: 05/19/2018 CLINICAL DATA:  Swelling at RIGHT second MCP joint and PIP joints LEFT hand post fall (confirmed with performing technologist that symptoms are RIGHT hand not LEFT) EXAM: RIGHT HAND - 2 VIEW COMPARISON:  None FINDINGS: Artifacts from IV catheter and tubing. Bones demineralized. Minimal scattered narrowing of IP joints RIGHT hand. Remaining joint spaces preserved. No acute fracture, dislocation, or bone destruction. IMPRESSION: No acute osseous abnormalities. Electronically Signed   By: Ulyses Southward M.D.   On: 05/19/2018 09:32   Vas [place] Lower Extremity Venous (dvt)  Result Date: 05/19/2018  Lower Venous Study Indications: Edema, and Swelling.  Performing Technologist: Blanch Media RVS  Examination Guidelines: A complete evaluation includes B-mode imaging, spectral Doppler, color Doppler, and power Doppler as needed of all accessible portions of each vessel. Bilateral testing is considered an integral part of a complete examination. Limited examinations for reoccurring indications may be performed as noted.  Right Venous Findings: +---------+---------------+---------+-----------+----------+-------+           CompressibilityPhasicitySpontaneityPropertiesSummary +---------+---------------+---------+-----------+----------+-------+ CFV      Full           Yes      Yes                          +---------+---------------+---------+-----------+----------+-------+ SFJ      Full                                                 +---------+---------------+---------+-----------+----------+-------+  FV Prox  Full                                                 +---------+---------------+---------+-----------+----------+-------+ FV Mid   Full                                                 +---------+---------------+---------+-----------+----------+-------+ FV DistalFull                                                 +---------+---------------+---------+-----------+----------+-------+ PFV      Full                                                 +---------+---------------+---------+-----------+----------+-------+ POP      Full           Yes      Yes                          +---------+---------------+---------+-----------+----------+-------+ PTV      Full                                                 +---------+---------------+---------+-----------+----------+-------+ PERO     Full                                                 +---------+---------------+---------+-----------+----------+-------+  Left Venous Findings: +---------+---------------+---------+-----------+----------+-------+          CompressibilityPhasicitySpontaneityPropertiesSummary +---------+---------------+---------+-----------+----------+-------+ CFV      Full           Yes      Yes                          +---------+---------------+---------+-----------+----------+-------+ SFJ      Full                                                 +---------+---------------+---------+-----------+----------+-------+ FV Prox  Full                                                  +---------+---------------+---------+-----------+----------+-------+ FV Mid   Full                                                 +---------+---------------+---------+-----------+----------+-------+  FV DistalFull                                                 +---------+---------------+---------+-----------+----------+-------+ PFV      Full                                                 +---------+---------------+---------+-----------+----------+-------+ POP      Full           Yes      Yes                          +---------+---------------+---------+-----------+----------+-------+ PTV      Full                                                 +---------+---------------+---------+-----------+----------+-------+ PERO     Full                                                 +---------+---------------+---------+-----------+----------+-------+    Summary: Right: There is no evidence of deep vein thrombosis in the lower extremity. No cystic structure found in the popliteal fossa. Left: There is no evidence of deep vein thrombosis in the lower extremity. No cystic structure found in the popliteal fossa.  *See table(s) above for measurements and observations.    Preliminary     Scheduled Meds: . aspirin EC  81 mg Oral Daily  . enoxaparin (LOVENOX) injection  40 mg Subcutaneous Daily  . sodium chloride flush  3 mL Intravenous Q12H  . umeclidinium bromide  1 puff Inhalation Daily   Continuous Infusions: . sodium chloride 100 mL/hr at 05/18/18 0948   PRN Meds:.acetaminophen **OR** acetaminophen, ondansetron **OR** ondansetron (ZOFRAN) IV, polyethylene glycol  CARDIAC STUDIES: EKG 05/18/2018: Normal sinus rhythm with rate of 72 bpm, inferior and lateral ST depression with T wave inversion consistent with non-STEMI.  Low voltage complexes.  EKG 05/19/2018: Normal sinus rhythm with rate of 67 bpm, minimal ST depression in the inferior and lateral leads persist but much  improved compared to yesterday.  Echocardiogram 05/18/2018: Normal LV systolic function, EF 55 to 60%.  No wall motion abnormality.  Normal right ventricle.  Trivial pericardial effusion.  Normal IVC.  ASSESSMENT AND PLAN:  1.  Non-STEMI involving the inferior wall. 2.  Syncope, probably vasovagal, episode may be related to underlying high vagal tone from inferior non-STEMI versus an arrhythmic event. 3.  Shortness of breath and dyspnea on exertion, she probably has a component of COPD/chronic bronchitis.  Scarring is noted by CT scan. 4.  40+ pack tobacco use history 5.  Essential hypertension 6.  Mild hyperlipidemia  Recommendation: Patient's presentation is consistent with ACS, will start the patient on IV heparin.  Do not suspect pulmonary embolism, no right ventricular strain on the EKG or by echocardiogram, I have discussed the findings with the patient and recommended left heart catheterization possible angioplasty which she is willing  to proceed.  I will start the patient also on statins along with low-dose beta-blocker.  I have discussed my consultation with  Reymundo Poll, MD.  Appreciate the consult.  Schedule for cardiac catheterization, and possible angioplasty. We discussed regarding risks, benefits, alternatives to this including stress testing, CTA and continued medical therapy. Patient wants to proceed. Understands <1-2% risk of death, stroke, MI, urgent CABG, bleeding, infection, renal failure but not limited to these.  Has mild hyperlipidemia, I also discussed with her regarding smoking cessation. Left message with her husband.   Yates Decamp, MD, [facility] 05/19/2018, 2:20 PM [facility] Pager: [phone] Office: [phone] If no answer Cell [phone]

## 2018-05-19 NOTE — Evaluation (Signed)
Physical Therapy Evaluation and Discharge Patient Details Name: Brenda Goodwin MRN: [id number] DOB: May 23, 1955 Today's Date: 05/19/2018   History of Present Illness  Pt is a 63 y/o female admitted secondary to syncopal episode of unclear origin. CT chest showed a small area of reticulonodular interstitial disease in the right middle lobe consistent with infectious or inflammatory process with some associated scarring in the lingula. PMH includes COPD and HTN.    Clinical Impression  Patient evaluated by Physical Therapy with no further acute PT needs identified. All education has been completed and the patient has no further questions. Pt overall steady during gait and stair navigation. Pt scoring 22 on DGI indicating low fall risk. Pt asymptomatic throughout gait and oxygen sats at 95-96% on RA throughout mobility. See below for any follow-up Physical Therapy or equipment needs. PT is signing off. Thank you for this referral. If needs change, please reconsult.      Follow Up Recommendations No PT follow up    Equipment Recommendations  None recommended by PT    Recommendations for Other Services       Precautions / Restrictions Precautions Precautions: None Restrictions Weight Bearing Restrictions: No      Mobility  Bed Mobility               General bed mobility comments: In recliner upon entry.   Transfers Overall transfer level: Independent                  Ambulation/Gait Ambulation/Gait assistance: Independent Gait Distance (Feet): 300 Feet Assistive device: None Gait Pattern/deviations: WFL(Within Functional Limits) Gait velocity: WFL  Gait velocity interpretation: >2.62 ft/sec, indicative of community ambulatory General Gait Details: Steady gait with good gait speed. Able to perform DGI tasks without LOB. Oxygen sats at 95-96% on RA.   Stairs Stairs: Yes Stairs assistance: Supervision Stair Management: One rail Left;Alternating  pattern;Forwards Number of Stairs: 3 General stair comments: Overall steady stair navigation. No LOB noted.   Wheelchair Mobility    Modified Rankin (Stroke Patients Only)       Balance Overall balance assessment: Independent                               Standardized Balance Assessment Standardized Balance Assessment : Dynamic Gait Index   Dynamic Gait Index Level Surface: Normal Change in Gait Speed: Normal Gait with Horizontal Head Turns: Normal Gait with Vertical Head Turns: Normal Gait and Pivot Turn: Mild Impairment Step Over Obstacle: Normal Step Around Obstacles: Normal Steps: Mild Impairment Total Score: 22       Pertinent Vitals/Pain Pain Assessment: No/denies pain    Home Living Family/patient expects to be discharged to:: Private residence Living Arrangements: Spouse/significant other Available Help at Discharge: Family;Available 24 hours/day Type of Home: House Home Access: Stairs to enter Entrance Stairs-Rails: Doctor, general practice of Steps: 3 Home Layout: One level Home Equipment: None      Prior Function Level of Independence: Independent         Comments: 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     Hand Dominance        Extremity/Trunk Assessment   Upper Extremity Assessment Upper Extremity Assessment: Overall WFL for tasks assessed    Lower Extremity Assessment Lower Extremity Assessment: Overall WFL for tasks assessed    Cervical / Trunk Assessment Cervical / Trunk Assessment: Normal  Communication   Communication: No difficulties  Cognition Arousal/Alertness: Awake/alert Behavior During Therapy:  WFL for tasks assessed/performed Overall Cognitive Status: Within Functional Limits for tasks assessed                                        General Comments General comments (skin integrity, edema, etc.): Pt asymptomatic throughout gait.     Exercises     Assessment/Plan    PT Assessment  Patent does not need any further PT services  PT Problem List         PT Treatment Interventions      PT Goals (Current goals can be found in the Care Plan section)  Acute Rehab PT Goals Patient Stated Goal: to go home ASAP  PT Goal Formulation: All assessment and education complete, DC therapy Time For Goal Achievement: 05/19/18 Potential to Achieve Goals: Good    Frequency     Barriers to discharge        Co-evaluation               AM-PAC PT "6 Clicks" Mobility  Outcome Measure Help needed turning from your back to your side while in a flat bed without using bedrails?: None Help needed moving from lying on your back to sitting on the side of a flat bed without using bedrails?: None Help needed moving to and from a bed to a chair (including a wheelchair)?: None Help needed standing up from a chair using your arms (e.g., wheelchair or bedside chair)?: None Help needed to walk in hospital room?: None Help needed climbing 3-5 steps with a railing? : None 6 Click Score: 24    End of Session Equipment Utilized During Treatment: Gait belt Activity Tolerance: Patient tolerated treatment well Patient left: in chair;with call bell/phone within reach Nurse Communication: Mobility status PT Visit Diagnosis: Other abnormalities of gait and mobility (R26.89)    Time: 2671-2458 PT Time Calculation (min) (ACUTE ONLY): 12 min   Charges:   PT Evaluation $PT Eval Low Complexity: 1 Low          Gladys Damme, PT, DPT  Acute Rehabilitation Services  Pager: [phone] Office: [phone]   Lehman Prom 05/19/2018, 12:43 PM

## 2018-05-19 NOTE — Progress Notes (Signed)
Pt. Troponin 0.08. On call for IMTS paged to make aware.

## 2018-05-19 NOTE — Progress Notes (Signed)
I was able to get in touch with her husband Dave Nabozny and explained the procedure. 

## 2018-05-19 NOTE — Progress Notes (Signed)
ANTICOAGULATION CONSULT NOTE - Initial Consult  Pharmacy Consult for heparin Indication: chest pain/ACS  No Known Allergies  Patient Measurements: Height: 5\' 7"  (170.2 cm) Weight: 172 lb 14.4 oz (78.4 kg) IBW/kg (Calculated) : 61.6 Heparin Dosing Weight: 78kg  Vital Signs: Temp: 98.3 F (36.8 C) (03/24 1217) Temp Source: Oral (03/24 1217) BP: 122/44 (03/24 1217) Pulse Rate: 73 (03/24 1217)  Labs: Recent Labs    05/18/18 0813  05/18/18 1645 05/18/18 2249 05/19/18 0407  HGB 13.9  --   --   --  11.8*  HCT 42.7  --   --   --  37.1  PLT 202  --   --   --  180  CREATININE 1.20*  --   --   --  1.02*  TROPONINI  --    < > <0.03 0.13* 0.08*   < > = values in this interval not displayed.    Estimated Creatinine Clearance: 60.9 mL/min (A) (by C-G formula based on SCr of 1.02 mg/dL (H)).   Medical History: Past Medical History:  Diagnosis Date  . Hypertension    Assessment: 63 year old female admitted for syncope, possible respiratory infection. Cardiology suspects nstemi involving inferior wall, new orders to start IV heparin.   Goal of Therapy:  Heparin level 0.3-0.7 units/ml Monitor platelets by anticoagulation protocol: Yes   Plan:  Give 4000 units bolus x 1 Start heparin infusion at 1000 units/hr Check anti-Xa level in 8 hours and daily while on heparin Continue to monitor H&H and platelets  Sheppard Coil PharmD., BCPS Clinical Pharmacist 05/19/2018 3:14 PM

## 2018-05-19 NOTE — Progress Notes (Signed)
Noted no insurance - No PCP. Hospital f/u appointment scheduled at [facility] on 06/08/2018 at 11:00 AM with Dr. Shan Levans.   Bess Kinds, RN MSN CCM Transitions of Care [phone]

## 2018-05-20 ENCOUNTER — Encounter (HOSPITAL_COMMUNITY)
Admission: EM | Disposition: A | Discharge status: Home / Self Care | Payer: Self-pay | Source: Home / Self Care | Attending: Internal Medicine

## 2018-05-20 DIAGNOSIS — J849 Interstitial pulmonary disease, unspecified: Secondary | ICD-10-CM

## 2018-05-20 DIAGNOSIS — Z87448 Personal history of other diseases of urinary system: Secondary | ICD-10-CM

## 2018-05-20 DIAGNOSIS — Z7902 Long term (current) use of antithrombotics/antiplatelets: Secondary | ICD-10-CM

## 2018-05-20 DIAGNOSIS — Z7982 Long term (current) use of aspirin: Secondary | ICD-10-CM

## 2018-05-20 DIAGNOSIS — Z6826 Body mass index (BMI) 26.0-26.9, adult: Secondary | ICD-10-CM

## 2018-05-20 HISTORY — PX: LEFT HEART CATH AND CORONARY ANGIOGRAPHY: CATH118249

## 2018-05-20 LAB — CBC
HCT: 39.2 % (ref 36.0–46.0)
Hemoglobin: 12.8 g/dL (ref 12.0–15.0)
MCH: 28.1 pg (ref 26.0–34.0)
MCHC: 32.7 g/dL (ref 30.0–36.0)
MCV: 86 fL (ref 80.0–100.0)
Platelets: 204 10*3/uL (ref 150–400)
RBC: 4.56 MIL/uL (ref 3.87–5.11)
RDW: 12.2 % (ref 11.5–15.5)
WBC: 9.4 10*3/uL (ref 4.0–10.5)
nRBC: 0 % (ref 0.0–0.2)

## 2018-05-20 LAB — HEPARIN LEVEL (UNFRACTIONATED)
Heparin Unfractionated: 0.32 IU/mL (ref 0.30–0.70)
Heparin Unfractionated: 0.55 IU/mL (ref 0.30–0.70)

## 2018-05-20 LAB — GLUCOSE, CAPILLARY: Glucose-Capillary: 91 mg/dL (ref 70–99)

## 2018-05-20 LAB — POCT ACTIVATED CLOTTING TIME: Activated Clotting Time: 257 seconds

## 2018-05-20 SURGERY — LEFT HEART CATH AND CORONARY ANGIOGRAPHY
Anesthesia: LOCAL

## 2018-05-20 MED ORDER — SODIUM CHLORIDE 0.9% FLUSH: 3.0000 mL | Freq: Two times a day (BID) | INTRAVENOUS | Status: DC

## 2018-05-20 MED ORDER — MIDAZOLAM HCL 2 MG/2ML IJ SOLN
INTRAMUSCULAR | Status: DC | PRN
  Administered 2018-05-20: 2 mg via INTRAVENOUS

## 2018-05-20 MED ORDER — IOHEXOL 350 MG/ML SOLN
INTRAVENOUS | Status: DC | PRN
  Administered 2018-05-20: 70 mL via INTRACARDIAC

## 2018-05-20 MED ORDER — PRASUGREL HCL 10 MG PO TABS: 10.0000 mg | ORAL_TABLET | Freq: Every day | ORAL | 0 refills | Status: AC

## 2018-05-20 MED ORDER — PRASUGREL HCL 10 MG PO TABS
ORAL_TABLET | ORAL | Status: DC | PRN
  Administered 2018-05-20: 60 mg via ORAL

## 2018-05-20 MED ORDER — METOPROLOL SUCCINATE ER 25 MG PO TB24: 25.0000 mg | ORAL_TABLET | Freq: Every day | ORAL | 0 refills | Status: AC

## 2018-05-20 MED ORDER — HEPARIN SODIUM (PORCINE) 1000 UNIT/ML IJ SOLN
INTRAMUSCULAR | Status: AC
  Filled 2018-05-20: qty 1

## 2018-05-20 MED ORDER — LIDOCAINE HCL (PF) 1 % IJ SOLN
INTRAMUSCULAR | Status: DC | PRN
  Administered 2018-05-20: 2 mL via INTRADERMAL

## 2018-05-20 MED ORDER — UMECLIDINIUM BROMIDE 62.5 MCG/INH IN AEPB: 1.0000 | INHALATION_SPRAY | Freq: Every day | RESPIRATORY_TRACT | 0 refills | Status: DC

## 2018-05-20 MED ORDER — LIDOCAINE HCL (PF) 1 % IJ SOLN
INTRAMUSCULAR | Status: AC
  Filled 2018-05-20: qty 30

## 2018-05-20 MED ORDER — MIDAZOLAM HCL 2 MG/2ML IJ SOLN
INTRAMUSCULAR | Status: AC
  Filled 2018-05-20: qty 2

## 2018-05-20 MED ORDER — PRASUGREL HCL 10 MG PO TABS
ORAL_TABLET | ORAL | Status: AC
  Filled 2018-05-20: qty 6

## 2018-05-20 MED ORDER — VERAPAMIL HCL 2.5 MG/ML IV SOLN
INTRA_ARTERIAL | Status: DC | PRN
  Administered 2018-05-20: 10 mL via INTRA_ARTERIAL

## 2018-05-20 MED ORDER — HYDROMORPHONE HCL 1 MG/ML IJ SOLN
INTRAMUSCULAR | Status: AC
  Filled 2018-05-20: qty 0.5

## 2018-05-20 MED ORDER — HEPARIN (PORCINE) IN NACL 1000-0.9 UT/500ML-% IV SOLN
INTRAVENOUS | Status: DC | PRN
  Administered 2018-05-20 (×2): 500 mL via INTRACARDIAC

## 2018-05-20 MED ORDER — PRASUGREL HCL 10 MG PO TABS
ORAL_TABLET | ORAL | Status: AC
  Filled 2018-05-20: qty 1

## 2018-05-20 MED ORDER — HYDROMORPHONE HCL 1 MG/ML IJ SOLN
INTRAMUSCULAR | Status: DC | PRN
  Administered 2018-05-20: 0.5 mg via INTRAVENOUS

## 2018-05-20 MED ORDER — TIOTROPIUM BROMIDE MONOHYDRATE 2.5 MCG/ACT IN AERS: 2.0000 | INHALATION_SPRAY | Freq: Every day | RESPIRATORY_TRACT | 0 refills | Status: AC

## 2018-05-20 MED ORDER — ASPIRIN 81 MG PO TBEC: 81.0000 mg | DELAYED_RELEASE_TABLET | Freq: Every day | ORAL | 0 refills | Status: AC

## 2018-05-20 MED ORDER — PRASUGREL HCL 10 MG PO TABS: 10.0000 mg | ORAL_TABLET | Freq: Every day | ORAL | Status: DC

## 2018-05-20 MED ORDER — ATORVASTATIN CALCIUM 80 MG PO TABS: 80.0000 mg | ORAL_TABLET | Freq: Every day | ORAL | 0 refills | Status: AC

## 2018-05-20 MED ORDER — SODIUM CHLORIDE 0.9% FLUSH: 3.0000 mL | INTRAVENOUS | Status: DC | PRN

## 2018-05-20 MED ORDER — HEPARIN SODIUM (PORCINE) 1000 UNIT/ML IJ SOLN
INTRAMUSCULAR | Status: DC | PRN
  Administered 2018-05-20: 2000 [IU] via INTRAVENOUS
  Administered 2018-05-20: 6000 [IU] via INTRAVENOUS

## 2018-05-20 MED ORDER — VERAPAMIL HCL 2.5 MG/ML IV SOLN: INTRAVENOUS | Status: DC | PRN

## 2018-05-20 MED ORDER — SODIUM CHLORIDE 0.9 % IV SOLN: 250.0000 mL | INTRAVENOUS | Status: DC | PRN

## 2018-05-20 MED ORDER — NITROGLYCERIN 1 MG/10 ML FOR IR/CATH LAB
INTRA_ARTERIAL | Status: AC
  Filled 2018-05-20: qty 10

## 2018-05-20 MED ORDER — VERAPAMIL HCL 2.5 MG/ML IV SOLN
INTRAVENOUS | Status: AC
  Filled 2018-05-20: qty 2

## 2018-05-20 MED ORDER — SODIUM CHLORIDE 0.9 % WEIGHT BASED INFUSION: 1.0000 mL/kg/h | INTRAVENOUS | Status: AC

## 2018-05-20 MED ORDER — HEPARIN (PORCINE) IN NACL 1000-0.9 UT/500ML-% IV SOLN
INTRAVENOUS | Status: AC
  Filled 2018-05-20: qty 1000

## 2018-05-20 SURGICAL SUPPLY — 12 items
CATH OPTITORQUE TIG 4.0 5F (CATHETERS) ×2 IMPLANT
CATH VISTA GUIDE 6FR XB3.5 (CATHETERS) ×2 IMPLANT
DEVICE RAD COMP TR BAND LRG (VASCULAR PRODUCTS) ×2 IMPLANT
GLIDESHEATH SLEND A-KIT 6F 20G (SHEATH) ×2 IMPLANT
GUIDEWIRE INQWIRE 1.5J.035X260 (WIRE) ×1 IMPLANT
INQWIRE 1.5J .035X260CM (WIRE) ×2
KIT ENCORE 26 ADVANTAGE (KITS) ×2 IMPLANT
KIT HEART LEFT (KITS) ×2 IMPLANT
PACK CARDIAC CATHETERIZATION (CUSTOM PROCEDURE TRAY) ×2 IMPLANT
TRANSDUCER W/STOPCOCK (MISCELLANEOUS) ×2 IMPLANT
TUBING CIL FLEX 10 FLL-RA (TUBING) ×2 IMPLANT
WIRE COUGAR XT STRL 190CM (WIRE) ×2 IMPLANT

## 2018-05-20 NOTE — Interval H&P Note (Signed)
History and Physical Interval Note:  05/20/2018 8:54 AM  Brenda Goodwin  has presented today for surgery, with the diagnosis of n stemi.  The various methods of treatment have been discussed with the patient and family. After consideration of risks, benefits and other options for treatment, the patient has consented to  Procedure(s): LEFT HEART CATH AND CORONARY ANGIOGRAPHY (N/A) and possible angioplasty as a surgical intervention.  The patient's history has been reviewed, patient examined, no change in status, stable for surgery.  I have reviewed the patient's chart and labs.  Questions were answered to the patient's satisfaction.   Cath Lab Visit (complete for each Cath Lab visit)  Clinical Evaluation Leading to the Procedure:   ACS: Yes.    Non-ACS:    Anginal Classification: CCS IV  Anti-ischemic medical therapy: Minimal Therapy (1 class of medications)  Non-Invasive Test Results: No non-invasive testing performed  Prior CABG: No previous CABG        Brenda Goodwin

## 2018-05-20 NOTE — Progress Notes (Signed)
Spiriva coupon card given to patient with explanation of usage; B Shelba Flake [phone]

## 2018-05-20 NOTE — Discharge Instructions (Signed)
Salvatore Decent,   It has been a pleasure working with you and we are glad you're feeling better. You were hospitalized because you passed out. You were found to have a heart attack, the cardiologists did a procedure to look at the vessels of your heart and they recommended medical management. It will be important for you to start taking the following medications.    START taking aspirin 81 mg daily START taking prasugrel 10 mg daily Start taking Lipitor 80 mg daily Start taking metoprolol 25 mg daily Stop taking hydrochlorothiazide Follow up with the Cardiologist, they should contact you to set up an appointment  You were noted to have an abnormal finding on your CT scan of your chest, we are unsure if this is new or chronic but it could be related to your chronic coughing.  It will be important for you to repeat this test in 3 months with your primary care physician.  You can use the Spiriva 2 puffs once a day for now and follow up with your PCP.  Follow up with your primary care provider in 1-2 weeks  If your symptoms worsen or you develop new symptoms, please seek medical help whether it is your primary care provider or emergency department.

## 2018-05-20 NOTE — Discharge Summary (Signed)
Name: Brenda Goodwin MRN: [id number] DOB: Jul 22, 1955 63 y.o. PCP: Shelly Rubenstein, MD  Date of Admission: 05/18/2018  7:58 AM Date of Discharge: 05/20/2018 Attending Physician: Doneen Poisson, MD  Discharge Diagnosis: 1. Syncope 2/2 NSTEMI 2. Chronic cough 3. Hypokalemia  Discharge Medications: Allergies as of 05/20/2018   No Known Allergies     Medication List    STOP taking these medications   hydrochlorothiazide 12.5 MG capsule Commonly known as:  MICROZIDE   ibuprofen 200 MG tablet Commonly known as:  ADVIL,MOTRIN     TAKE these medications   aspirin 81 MG EC tablet Take 1 tablet (81 mg total) by mouth daily.   atorvastatin 80 MG tablet Commonly known as:  LIPITOR Take 1 tablet (80 mg total) by mouth daily at 6 PM.   cholecalciferol 25 MCG (1000 UT) tablet Commonly known as:  VITAMIN D3 Take 1,000 Units by mouth daily.   metoprolol succinate 25 MG 24 hr tablet Commonly known as:  TOPROL-XL Take 1 tablet (25 mg total) by mouth daily.   prasugrel 10 MG Tabs tablet Commonly known as:  EFFIENT Take 1 tablet (10 mg total) by mouth daily.   Tiotropium Bromide Monohydrate 2.5 MCG/ACT Aers Commonly known as:  Spiriva Respimat Inhale 2 puffs into the lungs daily.       Disposition and follow-up:   [name] was discharged from [facility] in Stable condition.  At the hospital follow up visit please address:  1.  NSTEMI: Medical management with ASA and Plavix, she will likely need a stress test in 8-12 weeks. Make sure she has follow up with cardiology.   Chronic cough: Given Spiriva, however CT scan findings were less consistent with COPD. 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 She will need full PFTs and a repeat CT scan in 3 months.   Hypokalemia: Improved to 3.8 on discharge.   2.  Labs / imaging needed at time of follow-up: CT chest in 3 months,  full PFTs, BMP, CBC  3.  Pending labs/ test needing follow-up: none  Follow-up Appointments: Follow-up Information    [place], Etta Quill, MD. Schedule an appointment as soon as possible for a visit in 2 week(s).   Contact information: [address] [place] [address] [phone]        [facility] Follow up.   Why:  They should contact you to set up a follow up appointment, if you don't hear from them withen the week please contact them.  Contact information: [address] [place] [address]          Hospital Course by problem list: 1. Syncope 2/2 NSTEMI: This is a 63 year old female with a history of HTN who presented after a syncopal episode whil at work, preceded by dizziness. Associated with persistent cough with clear mucus and nasal congestion. On admission she was noted to become hypoxic down to the 80s requiring 2L O2. Labs showed leukocytosis to 12.8, and K of 3.2 CXR showed right middle lobe infiltrate. Her troponins peaked at 0.13, EKG showed inferior and lateral ST depression with TWI consistent with NSTEMI. Cardiology was consulted and recommended heparin drip. Echocardiogram showed normal EF with no wall motion abnormalities. Cardiac cath showed mild disease in the proximal and mid segment of the right coronary artery, PDA 50-60% stenosis, LAD had moderate diffuse disease  constituting 50-60% stenosis at the bifurcation of large D2, circumflex coronary artery stable proximal segment with mildly ulcerated 50-60% stenosis. 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 She was started on ASA and Prasugrel. She will follow up with cardiology with a possible stress test in 8-12 weeks.    2. Chronic cough: Patient is a current smoker and had a chronic cough since December. CXR showed a RLL infiltrates. CT scan showed small area of interstitial disease in the right middle lobe that  was read as consistent with infection or inflammatory reaction. Findings have been less concerning for a PNA, procalcitonin was not elevated at 0.19. This was less concerning for a COPD however she was started on Incruse which she reported helped. She will need a repeat CT scan in 3 months and full PFTs.   3. Hypokalemia: K was 3.2 on admission, this was repleted and K improved to 3.8.   Discharge Vitals:   BP (!) 125/48   Pulse 60   Temp (!) 97.5 F (36.4 C)   Resp 20   Ht 5\' 7"  (1.702 m)   Wt 78.1 kg Comment: scale c  SpO2 100%   BMI 26.97 kg/m   Pertinent Labs, Studies, and Procedures:  CBC Latest Ref Rng & Units 05/19/2018 05/19/2018 05/18/2018  WBC 4.0 - 10.5 K/uL 9.4 8.3 12.8(H)  Hemoglobin 12.0 - 15.0 g/dL 16.112.8 11.8(L) 13.9  Hematocrit 36.0 - 46.0 % 39.2 37.1 42.7  Platelets 150 - 400 K/uL 204 180 202   BMP Latest Ref Rng & Units 05/19/2018 05/18/2018  Glucose 70 - 99 mg/dL 096(E103(H) 454(U151(H)  BUN 8 - 23 mg/dL 9 12  Creatinine 9.810.44 - 1.00 mg/dL 1.91(Y1.02(H) 7.82(N1.20(H)  Sodium 135 - 145 mmol/L 141 137  Potassium 3.5 - 5.1 mmol/L 3.8 3.2(L)  Chloride 98 - 111 mmol/L 107 104  CO2 22 - 32 mmol/L 24 18(L)  Calcium 8.9 - 10.3 mg/dL 8.9 9.1   CT chest:  IMPRESSION: 1. 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 Scarring in the right middle lobe and lingula.  Discharge Instructions: Discharge Instructions    Call MD for:  difficulty breathing, headache or visual disturbances   Complete by:  As directed    Call MD for:  extreme fatigue   Complete by:  As directed    Call MD for:  hives   Complete by:  As directed    Call MD for:  persistant dizziness or light-headedness   Complete by:  As directed    Call MD for:  persistant nausea and vomiting   Complete by:  As directed    Call MD for:  redness, tenderness, or signs of infection (pain, swelling, redness, odor or  green/yellow discharge around incision site)   Complete by:  As directed    Call MD for:  severe uncontrolled pain   Complete by:  As directed    Call MD for:  temperature >100.4   Complete by:  As directed    Diet - low sodium heart healthy   Complete by:  As directed    Discharge instructions   Complete by:  As directed    Salvatore DecentWanda R Penland,   It has been a pleasure working with you and we are glad you're feeling better. You were hospitalized because you passed out. You were found to have a heart attack, the cardiologists  did a procedure to look at the vessels of your heart and they recommended medical management. It will be important for you to start taking the following medications.    START taking aspirin 81 mg daily START taking prasugrel 10 mg daily Start taking Lipitor 80 mg daily Start taking metoprolol 25 mg daily Stop taking hydrochlorothiazide Follow up with the Cardiologist, they should contact you to set up an appointment  You were noted to have an abnormal finding on your CT scan of your chest, we are unsure if this is new or chronic but it could be related to your chronic coughing.  It will be important for you to repeat this test in 3 months with your primary care physician.  You can use the Spiriva 2 puffs once a day for now and follow up with your PCP.  Follow up with your primary care provider in 1-2 weeks  If your symptoms worsen or you develop new symptoms, please seek medical help whether it is your primary care provider or emergency department.   Increase activity slowly   Complete by:  As directed       Signed: Claudean Severance, MD 05/21/2018, 1:46 PM   Pager: [phone]

## 2018-05-20 NOTE — Progress Notes (Signed)
ANTICOAGULATION CONSULT NOTE - Follow Up Consult  Pharmacy Consult for heparin Indication: NSTEMI  Labs: Recent Labs    05/18/18 0813  05/18/18 1645 05/18/18 2249 05/19/18 0407 05/19/18 2352  HGB 13.9  --   --   --  11.8* 12.8  HCT 42.7  --   --   --  37.1 39.2  PLT 202  --   --   --  180 204  HEPARINUNFRC  --   --   --   --   --  0.32  CREATININE 1.20*  --   --   --  1.02*  --   TROPONINI  --    < > <0.03 0.13* 0.08*  --    < > = values in this interval not displayed.    Assessment: 63yo female therapeutic on heparin with initial dosing for NSTEMI though at low end of goal; no gtt issues or signs of bleeding per RN.  Goal of Therapy:  Heparin level 0.3-0.7 units/ml   Plan:  Will increase heparin gtt slightly to 1100 units/hr and check level in am.    Vernard Gambles, PharmD, BCPS  05/20/2018,12:31 AM

## 2018-05-20 NOTE — Progress Notes (Addendum)
ANTICOAGULATION CONSULT NOTE - Follow Up Consult  Pharmacy Consult for heparin Indication: NSTEMI  Labs: Recent Labs    05/18/18 0813  05/18/18 1645 05/18/18 2249 05/19/18 0407 05/19/18 2352 05/20/18 0746  HGB 13.9  --   --   --  11.8* 12.8  --   HCT 42.7  --   --   --  37.1 39.2  --   PLT 202  --   --   --  180 204  --   HEPARINUNFRC  --   --   --   --   --  0.32 0.55  CREATININE 1.20*  --   --   --  1.02*  --   --   TROPONINI  --    < > <0.03 0.13* 0.08*  --   --    < > = values in this interval not displayed.    Assessment: 63yo female therapeutic on heparin with initial dosing for NSTEMI; no gtt issues or signs of bleeding per RN.  Heparin level at goal this AM, then off for cath lab.  Goal of Therapy:  Heparin level 0.3-0.7 units/ml   Plan:  F/u plans for heparin after cath lab today.  Jenetta Downer, [facility] Clinical Pharmacist Phone [phone]  05/20/2018 8:47 AM

## 2018-05-20 NOTE — Progress Notes (Signed)
Subjective: Brenda Goodwin reports that she is doing well, she just got back from the cath lab, the cardiologist discussed the results with her and recommended medical management. She denied any new complaints today, reports that she had been up and walking yesterday with no issues, denied any dizziness or light headedness. She reported that the Spiriva helped her cough. She reported that she felt ready to go home. We discussed the plan and she is in agreement.   Objective:  Vital signs in last 24 hours: Vitals:   05/19/18 1217 05/19/18 1616 05/19/18 2003 05/20/18 0404  BP: (!) 122/44 (!) 119/51 (!) 121/55 140/61  Pulse: 73 71 60 64  Resp: 19 19 18 18   Temp: 98.3 F (36.8 C) 99.2 F (37.3 C) 98.4 F (36.9 C) 99 F (37.2 C)  TempSrc: Oral Oral Oral Oral  SpO2: 98% 95% 97% 96%  Weight:    78.1 kg  Height:        General: Well appearing, elderly female, NAD Cardiac: RRR, no m/r/g Pulmonary: Minimal bilateral wheezing, normal work of breathing, on RA Abdomen: Soft, non-tender, non-distended Extremity:No LE edema  Assessment/Plan:  Principal Problem:   Syncope and collapse Active Problems:   NSTEMI (non-ST elevated myocardial infarction) [facility])   This is a 63 year old female with a history of HTN who presented after a syncopal episode whil at work, preceded by dizziness. Denied any seizure like symptoms. Associated with persistent cough with clear mucus and nasal congestion. She also noted weight loss of 20-30lbs, and a history of urinary incontinence. On admission she was noted to become hypoxic down to the 80s requiring 2L O2. Labs showed leukocytosis to 12.8, and K of 3.2 CXR showed right middle lobe infiltrate treated with ceftriaxone and azithromycin, this was discontinued.   Syncope likely 2/2 cardiogenic: Inferior wall NSTEMI Presentation seems more consistent with a vasovagal/cardiogenic cause. Her troponins peaked at 0.13, EKG showed inferior and lateral ST depression with  TWI consistent with NSTEMI.  Echocardiogram showed normal EF 55-60%, and no wall motion abnormalities. Cardiac catheterization showed mild disease in the proximal and mid segment of the right coronary artery, PDA 50-60% stenosis, LAD had moderate diffuse disease constituting 50-60% stenosis at the bifurcation of large D2, circumflex coronary artery stable proximal segment with mildly ulcerated 50-60% stenosis.  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  They recommended trying medical therapy first, with outpatient follow-up and to consider stress test in 8 to 12 weeks.  Recommended DAPT with aspirin and Prasugrel. -Continue telemetry -Stop heparin drip -PT evaluated, no PT follow up recommendations -ASA 81 mg daily and prasugral 10 mg daily -Continue lipitor 80 mg daily -Continue metoprolol 25 mg daily  Chronic cough: Hypoxia: She is a current smoker, developed a chronic cough in December. CXR showed a RLL infiltrate, CT chest showed a small area of interstitial disease in the right middle lobe that was read as consistent with infection or inflammatory reaction. Findings have been less concerning for a PNA, procalcitonin was not elevated at 0.19. Her O2 sat with ambulation desaturatted to 88%. She has been having wheezing on exam. She has remained afebrile and has had no leukocytosis making an infectious process such as pneumonia less likely. Given her history of smoking there is a possibility that she has underlying COPD. She was started on spiriva yesterday, which she reports helped with her cough.  -Continue  Spiriva -Will need O/P PFTs -Will need repeat CT chest in 3 months  HTN: -Holding home HCTZ  Hypokalemia: -K on admission was 3.2, this was repleted and K today it was 3.8.  -Daily BMP  FEN: No fluids, replete lytes prn, NPO   VTE ppx: Lovenox  Code Status: FULL    Dispo: Anticipated discharge in approximately 1-2 day(s).   Claudean Severance, MD 05/20/2018, 7:10 AM Pager: [phone]

## 2018-05-21 ENCOUNTER — Telehealth: Payer: Self-pay

## 2018-05-21 ENCOUNTER — Encounter (HOSPITAL_COMMUNITY): Payer: Self-pay | Admitting: Cardiology

## 2018-05-21 NOTE — Telephone Encounter (Signed)
Location of hospitalization: [place] Reason for hospitalization: syncope, heart attack copd Date of discharge: 05/20/18 Date of first communication with patient: today w/ daughter  Person contacting patient: Me Current symptoms: n/a Do you understand why you were in the Hospital: Yes Questions regarding discharge instructions: None Where were you discharged to: Home Medications reviewed: Yes Allergies reviewed: Yes Dietary changes reviewed: Yes. Discussed low fat and low salt diet.  Referals reviewed: NA Activities of Daily Living: Able to with mild limitations Any transportation issues/concerns: None Any patient concerns: None Confirmed importance & date/time of Follow up appt: Yes Confirmed with patient if condition begins to worsen call. Pt was given the office number and encouraged to call back with questions or concerns: Yes

## 2018-05-21 NOTE — Progress Notes (Signed)
Received call from patient stated that she needed oxygen prior to going home. CM informed patient that no order was written for home oxygen and she must qualify for home oxygen. CM also referred her to her PCP; all questions answered. [facility] [phone]

## 2018-05-27 ENCOUNTER — Ambulatory Visit: Payer: Self-pay | Admitting: Cardiology

## 2018-06-02 ENCOUNTER — Encounter: Payer: Self-pay | Admitting: Cardiology

## 2018-06-02 DIAGNOSIS — R0600 Dyspnea, unspecified: Secondary | ICD-10-CM

## 2018-06-02 DIAGNOSIS — J4489 Other specified chronic obstructive pulmonary disease: Secondary | ICD-10-CM | POA: Insufficient documentation

## 2018-06-02 DIAGNOSIS — R0609 Other forms of dyspnea: Secondary | ICD-10-CM

## 2018-06-02 DIAGNOSIS — J449 Chronic obstructive pulmonary disease, unspecified: Secondary | ICD-10-CM

## 2018-06-02 DIAGNOSIS — I739 Peripheral vascular disease, unspecified: Secondary | ICD-10-CM

## 2018-06-02 HISTORY — DX: Dyspnea, unspecified: R06.00

## 2018-06-02 HISTORY — DX: Other forms of dyspnea: R06.09

## 2018-06-02 HISTORY — DX: Other specified chronic obstructive pulmonary disease: J44.89

## 2018-06-02 HISTORY — DX: Peripheral vascular disease, unspecified: I73.9

## 2018-06-02 HISTORY — DX: Chronic obstructive pulmonary disease, unspecified: J44.9

## 2018-06-02 NOTE — Progress Notes (Deleted)
Subjective:  Primary Physician:  Madolyn Frieze, Etta Quill, MD  Patient ID: Brenda Goodwin, female    DOB: 12/21/1955, 63 y.o.   MRN: [id number]  No chief complaint on file.   HPI: Brenda Goodwin  is a 63 y.o. female  with  hypertension, tobacco abuse, hyperlipidemia, COPD, hepatitis B&C, rheumatoid arthritis, presents for f/u of NSTEMI, claudication and dyspnea. She presented with NSTEMI on 05/20/2018 and Coronary Angiography revealed moderate to severe diffuse disease, medical therapy recommended.   Echocardiogram in Nov 2019 with mild basal septal hypertrophy without obstruction, he 55%, grade 2 diastolic dysfunction,  trace aortic stenosis, mild mitral and tricuspid regurgitation without pulmonary hypertension.  Lower Extremity Arterial Duplex showed severely reduce ABI on left 0.48 with diffuse monophasic waveforms throughout the lower asymmetry suggesting proximal left iliac artery significant stenosis.  Patient has had significant improvement in claudication symptoms with Cilostazol.  She was seen in our office in Jan 2020 for PAD.    Past Medical History:  Diagnosis Date  . Claudication in peripheral vascular disease (HCC) 06/02/2018  . Dyspnea on exertion 06/02/2018  . Hypertension   . Obstructive chronic bronchitis without exacerbation (HCC) 06/02/2018    Past Surgical History:  Procedure Laterality Date  . ABDOMINAL HYSTERECTOMY    . LEFT HEART CATH AND CORONARY ANGIOGRAPHY N/A 05/20/2018   Procedure: LEFT HEART CATH AND CORONARY ANGIOGRAPHY;  Surgeon: Yates Decamp, MD;  Location: [facility];  Service: Cardiovascular;  Laterality: N/A;    Social History   Socioeconomic History  . Marital status: Married    Spouse name: Not on file  . Number of children: Not on file  . Years of education: Not on file  . Highest education level: Not on file  Occupational History  . Not on file  Social Needs  . Financial resource strain: Not on file  . Food insecurity:    Worry: Not on file     Inability: Not on file  . Transportation needs:    Medical: Not on file    Non-medical: Not on file  Tobacco Use  . Smoking status: Current Every Day Smoker    Packs/day: 1.00    Types: Cigarettes  . Smokeless tobacco: Never Used  Substance and Sexual Activity  . Alcohol use: Not Currently  . Drug use: Never  . Sexual activity: Not on file  Lifestyle  . Physical activity:    Days per week: Not on file    Minutes per session: Not on file  . Stress: Not on file  Relationships  . Social connections:    Talks on phone: Not on file    Gets together: Not on file    Attends religious service: Not on file    Active member of club or organization: Not on file    Attends meetings of clubs or organizations: Not on file    Relationship status: Not on file  . Intimate partner violence:    Fear of current or ex partner: Not on file    Emotionally abused: Not on file    Physically abused: Not on file    Forced sexual activity: Not on file  Other Topics Concern  . Not on file  Social History Narrative  . Not on file    Current Outpatient Medications on File Prior to Visit  Medication Sig Dispense Refill  . aspirin EC 81 MG EC tablet Take 1 tablet (81 mg total) by mouth daily. 30 tablet 0  .  atorvastatin (LIPITOR) 80 MG tablet Take 1 tablet (80 mg total) by mouth daily at 6 PM. 30 tablet 0  . cholecalciferol (VITAMIN D3) 25 MCG (1000 UT) tablet Take 1,000 Units by mouth daily.    . metoprolol succinate (TOPROL-XL) 25 MG 24 hr tablet Take 1 tablet (25 mg total) by mouth daily. 30 tablet 0  . prasugrel (EFFIENT) 10 MG TABS tablet Take 1 tablet (10 mg total) by mouth daily. 30 tablet 0  . Tiotropium Bromide Monohydrate (SPIRIVA RESPIMAT) 2.5 MCG/ACT AERS Inhale 2 puffs into the lungs daily. 1 Inhaler 0   No current facility-administered medications on file prior to visit.     ***Review of Systems  Constitution: Negative for chills, decreased appetite, malaise/fatigue and weight  gain.  Cardiovascular: Positive for claudication and dyspnea on exertion. Negative for leg swelling and syncope.  Respiratory: Positive for sputum production and wheezing.   Endocrine: Negative for cold intolerance.  Hematologic/Lymphatic: Does not bruise/bleed easily.  Musculoskeletal: Negative for joint swelling.  Gastrointestinal: Negative for abdominal pain, anorexia and change in bowel habit.  Neurological: Negative for headaches and light-headedness.  Psychiatric/Behavioral: Negative for depression and substance abuse.  All other systems reviewed and are negative.      Objective:  There were no vitals taken for this visit. There is no height or weight on file to calculate BMI.  ***Physical Exam  Constitutional: She appears well-developed. No distress.  Moderately obese  HENT:  Head: Atraumatic.  Eyes: Conjunctivae are normal.  Neck: Neck supple. No thyromegaly present.  Short neck and difficult to evaluate JVP  Cardiovascular: Normal rate and regular rhythm. Exam reveals no gallop.  Murmur heard.  Harsh midsystolic murmur is present with a grade of 2/6 at the upper right sternal border radiating to the neck. Pulses:      Carotid pulses are 2+ on the right side with bruit and 2+ on the left side with bruit.      Femoral pulses are 2+ on the right side and 2+ on the left side.      Popliteal pulses are 0 on the right side and 0 on the left side.       Dorsalis pedis pulses are 2+ on the right side and 0 on the left side.       Posterior tibial pulses are 1+ on the right side and 0 on the left side.  Pulmonary/Chest: Effort normal. No respiratory distress. She has wheezes (bilateral scattered). She has rales (bilateral).  Abdominal: Soft. Bowel sounds are normal.  Obese. Pannus present  Musculoskeletal: Normal range of motion.        General: No edema.  Neurological: She is alert.  Skin: Skin is warm and dry.  Psychiatric: She has a normal mood and affect.    Radiology:  No results found.  Laboratory Examination: *** CMP Latest Ref Rng & Units 05/19/2018 05/18/2018  Glucose 70 - 99 mg/dL 967(R) 916(B)  BUN 8 - 23 mg/dL 9 12  Creatinine 8.46 - 1.00 mg/dL 6.59(D) 3.57(S)  Sodium 135 - 145 mmol/L 141 137  Potassium 3.5 - 5.1 mmol/L 3.8 3.2(L)  Chloride 98 - 111 mmol/L 107 104  CO2 22 - 32 mmol/L 24 18(L)  Calcium 8.9 - 10.3 mg/dL 8.9 9.1  Total Protein 6.5 - 8.1 g/dL 1.7(B) 6.5  Total Bilirubin 0.3 - 1.2 mg/dL 0.8 9.3(J)  Alkaline Phos 38 - 126 U/L 60 76  AST 15 - 41 U/L 17 32  ALT 0 - 44 U/L 11  16   CBC Latest Ref Rng & Units 05/19/2018 05/19/2018 05/18/2018  WBC 4.0 - 10.5 K/uL 9.4 8.3 12.8(H)  Hemoglobin 12.0 - 15.0 g/dL 16.1 11.8(L) 13.9  Hematocrit 36.0 - 46.0 % 39.2 37.1 42.7  Platelets 150 - 400 K/uL 204 180 202   Lipid Panel     Component Value Date/Time   CHOL 165 05/18/2018 1106   TRIG 108 05/18/2018 1106   HDL 34 (L) 05/18/2018 1106   CHOLHDL 4.9 05/18/2018 1106   VLDL 22 05/18/2018 1106   LDLCALC 109 (H) 05/18/2018 1106   HEMOGLOBIN A1C Lab Results  Component Value Date   HGBA1C 5.9 (H) 05/18/2018   MPG 122.63 05/18/2018   TSH Recent Labs    05/18/18 1106  TSH 1.665    CARDIAC STUDIES:   Lower extremity arterial duplex 01/19/2018: No hemodynamically significant stenoses are identified in the right lower extremity arterial system. Diffuse disease is noted. Diffuse monophasic waveforms of the left leg suggests proximal (Iliac artery) significant stenosis). Moderately abnormal waveforms of the right ankle. Severely abnormal waveforms of the left ankle. This exam reveals normal perfusion of the right lower extremity (ABI 1.03) and severely decreased perfusion of the left lower extremity, noted at the dorsalis pedis and post tibial artery level (ABI 0.48). Consider further vascular work-up. Trace aortic stenosis by prior echocardiogram (I35.0)  Echocardiogram 01/19/2018: Left ventricle cavity is normal in size. Mild  concentric hypertrophy of the left ventricle. Mild basal septal hypertrophy without LVOT obstruction. Normal global wall motion. Doppler evidence of grade II (pseudonormal) diastolic dysfunction, elevated LAP. Calculated EF 55%. Left atrial cavity is mildly dilated in apical views. Mild calcification of the aortic valve annulus. Mild aortic valve leaflet calcification. Mildly restricted aortic valve leaflets. No aortic valve regurgitation noted. Trace aortic valve stenosis. Aortic valve peak pressure gradient of 17 and mean gradient of 8 mmHg, calculated aortic valve area 1.82 cm. Normal appearing mitral valve with mild (Grade I) regurgitation. Mild tricuspid regurgitation. No evidence of pulmonary hypertension.  Lexiscan myoview stress test 02/13/2018: 1. The resting electrocardiogram demonstrated normal sinus rhythm, normal resting conduction and no resting arrhythmias. Stress EKG is non-diagnostic for ischemia as it a pharmacologic stress using Lexiscan. Stress symptoms included dyspnea, dizziness. 2. Myocardial perfusion imaging is normal. Overall left ventricular systolic function was normal without regional wall motion abnormalities. The left ventricular ejection fraction was 51%. This is a low risk study.  Coronary angiogram 05/20/2018: Right dominant circulation, mild disease in the proximal and mid segment of the right coronary artery, PDA has a focal 50 to 60% stenosis.  Large vessel. Left main is normal, LAD is a large-caliber vessel in the proximal segment but after the origin of D1 which is large, there is moderate diffuse disease constituting 50 to 60% stenosis at the bifurcation of large D2 which is LAD equivalent from the mid segment.  D2 has proximal 90% stenosis followed by a distal 90% stenosis.  Diffuse plaque burden noted. Circumflex coronary artery gives origin to very small marginals, proximal segment has a mildly ulcerated 50 to 60% stenosis appears to be stable. Left  ventricle systolic function is normal with normal LVEDP.  Assessment:    Coronary artery disease of native artery of native heart with stable angina pectoris (HCC)  Claudication in peripheral vascular disease (HCC)  Obstructive chronic bronchitis without exacerbation (HCC)  Dyspnea on exertion  Tobacco use  H/O rheumatoid arthritis  *** Recommendation:  Encouraged her to have a low threshold to contact me should  symptoms worsen as she has markedly abnormal lower extremity duplex and would have low threshold to proceed with peripheral angiogram. She does have injury to her left great toe from ingrown toenail that has been present for the last several months, but has been slowly healing. Does have small area of skin discoloration that she states has improved. We'll continue to closely monitor as she states this is improving. Discussed stress test results, no perfusion abnormality.   Yates DecampJay Millianna Szymborski, MD, [facility] Hospital[facility]ACC 06/02/2018, 8:32 PM [facility] Pager: [phone] Office: [phone] If no answer Cell [phone]

## 2018-06-03 ENCOUNTER — Ambulatory Visit (INDEPENDENT_AMBULATORY_CARE_PROVIDER_SITE_OTHER): Payer: Self-pay | Admitting: Cardiology

## 2018-06-03 ENCOUNTER — Encounter: Payer: Self-pay | Admitting: Cardiology

## 2018-06-03 ENCOUNTER — Other Ambulatory Visit: Payer: Self-pay

## 2018-06-03 ENCOUNTER — Ambulatory Visit: Payer: Self-pay | Admitting: Cardiology

## 2018-06-03 VITALS — HR 72 | Ht 67.5 in | Wt 172.0 lb

## 2018-06-03 DIAGNOSIS — I25118 Atherosclerotic heart disease of native coronary artery with other forms of angina pectoris: Secondary | ICD-10-CM

## 2018-06-03 DIAGNOSIS — E7800 Pure hypercholesterolemia, unspecified: Secondary | ICD-10-CM

## 2018-06-03 DIAGNOSIS — I214 Non-ST elevation (NSTEMI) myocardial infarction: Secondary | ICD-10-CM

## 2018-06-03 DIAGNOSIS — I10 Essential (primary) hypertension: Secondary | ICD-10-CM

## 2018-06-03 NOTE — Progress Notes (Signed)
Subjective:  Primary Physician:  Madolyn Frieze, Etta Quill, MD Virtual Visit via Video Note: This visit type was conducted due to national recommendations for restrictions regarding the COVID-19 Pandemic (e.g. social distancing).  This format is felt to be most appropriate for this patient at this time.  All issues noted in this document were discussed and addressed.  No physical exam was performed (except for noted visual exam findings with Telehealth visits).  The patient has consented to conduct a Telehealth visit and understands insurance will be billed.   I connected with@, on 06/03/18 at  by a video enabled telemedicine application and verified that I am speaking with the correct person using two identifiers.   I discussed the limitations of evaluation and management by telemedicine and the availability of in person appointments. The patient expressed understanding and agreed to proceed.   I have discussed with patient regarding the safety during COVID Pandemic and steps and precautions to be taken including social distancing, frequent hand wash and use of detergent soap, gels with the patient. I asked the patient to avoid touching mouth, nose, eyes, ears with her hands. I encouraged regular walking around the neighborhood and exercise and regular diet, as long as social distancing can be maintained.  Patient ID: Brenda Goodwin, female    DOB: [birth year], 63 y.o.   MRN: [id number]  Chief Complaint  Patient presents with  . Hospitalization Follow-up    LHC  . Loss of Consciousness    HPI: Brenda Goodwin  is a 63 y.o. female  with  hypertension, tobacco abuse, hyperlipidemia, COPD by exam during hospitalization on 05/18/2018 when she presented with syncope, she also had abnormal EKG and positive cardiac markers for non-STEMI.  Syncope was felt to be vasovagal due to severe nausea associated with myocardial infarction but arrhythmic etiology could not be completely excluded.  Since hospitalization  she has completely quit smoking.  She has been walking in the neighborhood without any significant chest discomfort, has had occasional chest discomfort lasting just less than a minute and is easily relieved.  She has not used any sublingual nitroglycerin.  States that her dyspnea is also improved.  Overall she feels well.  She requests letter for being absent from work.  Denies symptoms of TIA or claudication, tolerating all medications well.  Past Medical History:  Diagnosis Date  . Claudication in peripheral vascular disease (HCC) 06/02/2018  . Dyspnea on exertion 06/02/2018  . Hypertension   . Obstructive chronic bronchitis without exacerbation (HCC) 06/02/2018    Past Surgical History:  Procedure Laterality Date  . ABDOMINAL HYSTERECTOMY    . LEFT HEART CATH AND CORONARY ANGIOGRAPHY N/A 05/20/2018   Procedure: LEFT HEART CATH AND CORONARY ANGIOGRAPHY;  Surgeon: Yates Decamp, MD;  Location: [facility];  Service: Cardiovascular;  Laterality: N/A;    Social History   Socioeconomic History  . Marital status: Married    Spouse name: Not on file  . Number of children: 2  . Years of education: Not on file  . Highest education level: Not on file  Occupational History  . Not on file  Social Needs  . Financial resource strain: Not on file  . Food insecurity:    Worry: Not on file    Inability: Not on file  . Transportation needs:    Medical: Not on file    Non-medical: Not on file  Tobacco Use  . Smoking status: Former Smoker    Packs/day: 1.00  Types: Cigarettes    Last attempt to quit: 05/18/2018    Years since quitting: 0.0  . Smokeless tobacco: Never Used  Substance and Sexual Activity  . Alcohol use: Not Currently  . Drug use: Never  . Sexual activity: Not on file  Lifestyle  . Physical activity:    Days per week: Not on file    Minutes per session: Not on file  . Stress: Not on file  Relationships  . Social connections:    Talks on phone: Not on file    Gets  together: Not on file    Attends religious service: Not on file    Active member of club or organization: Not on file    Attends meetings of clubs or organizations: Not on file    Relationship status: Not on file  . Intimate partner violence:    Fear of current or ex partner: Not on file    Emotionally abused: Not on file    Physically abused: Not on file    Forced sexual activity: Not on file  Other Topics Concern  . Not on file  Social History Narrative  . Not on file    Current Outpatient Medications on File Prior to Visit  Medication Sig Dispense Refill  . ALPRAZolam (XANAX) 1 MG tablet as needed.    Marland Kitchen aspirin EC 81 MG EC tablet Take 1 tablet (81 mg total) by mouth daily. 30 tablet 0  . atorvastatin (LIPITOR) 80 MG tablet Take 1 tablet (80 mg total) by mouth daily at 6 PM. 30 tablet 0  . cholecalciferol (VITAMIN D3) 25 MCG (1000 UT) tablet Take 5,000 Units by mouth daily.     . Cyanocobalamin (VITAMIN B-12) 5000 MCG SUBL Place under the tongue daily.    Marland Kitchen loratadine (CLARITIN) 10 MG tablet Take 10 mg by mouth daily.    . metoprolol succinate (TOPROL-XL) 25 MG 24 hr tablet Take 1 tablet (25 mg total) by mouth daily. 30 tablet 0  . omeprazole (PRILOSEC) 20 MG capsule Take 20 mg by mouth daily.    . prasugrel (EFFIENT) 10 MG TABS tablet Take 1 tablet (10 mg total) by mouth daily. 30 tablet 0  . Tiotropium Bromide Monohydrate (SPIRIVA RESPIMAT) 2.5 MCG/ACT AERS Inhale 2 puffs into the lungs daily. 1 Inhaler 0   No current facility-administered medications on file prior to visit.    Review of Systems  Constitution: Negative for chills, decreased appetite, malaise/fatigue and weight gain.  Cardiovascular: Positive for chest pain (occasional and not used NTG) and dyspnea on exertion (stable). Negative for leg swelling and syncope.  Respiratory: Negative for sputum production and wheezing.   Endocrine: Negative for cold intolerance.  Hematologic/Lymphatic: Does not bruise/bleed  easily.  Musculoskeletal: Negative for joint swelling.  Gastrointestinal: Negative for abdominal pain, anorexia and change in bowel habit.  Neurological: Negative for headaches and light-headedness.  Psychiatric/Behavioral: Negative for depression and substance abuse.  All other systems reviewed and are negative.     Objective:  Pulse 72, height 5' 7.5" (1.715 m), weight 172 lb (78 kg), SpO2 99 %. Body mass index is 26.54 kg/m. Has not check BP recently.  Physical exam on her last visit is as below: Physical Exam  Constitutional: No distress.  Eyes: Conjunctivae are normal.  Neck: Neck supple. No JVD present.  Pulmonary/Chest: Effort normal.  Psychiatric: She has a normal mood and affect.   Radiology: No results found.  Laboratory Examination:  CMP Latest Ref Rng & Units 05/19/2018 05/18/2018  Glucose 70 - 99 mg/dL 161(W103(H) 960(A151(H)  BUN 8 - 23 mg/dL 9 12  Creatinine 5.400.44 - 1.00 mg/dL 9.81(X1.02(H) 9.14(N1.20(H)  Sodium 135 - 145 mmol/L 141 137  Potassium 3.5 - 5.1 mmol/L 3.8 3.2(L)  Chloride 98 - 111 mmol/L 107 104  CO2 22 - 32 mmol/L 24 18(L)  Calcium 8.9 - 10.3 mg/dL 8.9 9.1  Total Protein 6.5 - 8.1 g/dL 8.2(N5.6(L) 6.5  Total Bilirubin 0.3 - 1.2 mg/dL 0.8 5.6(O1.6(H)  Alkaline Phos 38 - 126 U/L 60 76  AST 15 - 41 U/L 17 32  ALT 0 - 44 U/L 11 16   CBC Latest Ref Rng & Units 05/19/2018 05/19/2018 05/18/2018  WBC 4.0 - 10.5 K/uL 9.4 8.3 12.8(H)  Hemoglobin 12.0 - 15.0 g/dL 13.012.8 11.8(L) 13.9  Hematocrit 36.0 - 46.0 % 39.2 37.1 42.7  Platelets 150 - 400 K/uL 204 180 202   Lipid Panel     Component Value Date/Time   CHOL 165 05/18/2018 1106   TRIG 108 05/18/2018 1106   HDL 34 (L) 05/18/2018 1106   CHOLHDL 4.9 05/18/2018 1106   VLDL 22 05/18/2018 1106   LDLCALC 109 (H) 05/18/2018 1106   HEMOGLOBIN A1C Lab Results  Component Value Date   HGBA1C 5.9 (H) 05/18/2018   MPG 122.63 05/18/2018   TSH Recent Labs    05/18/18 1106  TSH 1.665    CARDIAC STUDIES:   Coronary angiogram  05/20/2018: Right dominant circulation, mild disease in the proximal and mid segment of the right coronary artery, PDA has a focal 50 to 60% stenosis.  Large vessel. Left main is normal, LAD is a large-caliber vessel in the proximal segment but after the origin of D1 which is large, there is moderate diffuse disease constituting 50 to 60% stenosis at the bifurcation of large D2 which is LAD equivalent from the mid segment.  D2 has proximal 90% stenosis followed by a distal 90% stenosis.  Diffuse plaque burden noted. Circumflex coronary artery gives origin to very small marginals, proximal segment has a mildly ulcerated 50 to 60% stenosis appears to be stable. Left ventricle systolic function is normal with normal LVEDP.  Echocardiogram 05/18/2018 :  1. The left ventricle has normal systolic function, with an ejection fraction of 55-60%. The cavity size was normal. Left ventricular diastolic Doppler parameters are consistent with pseudonormalization. No evidence of left ventricular regional wall  motion abnormalities.  2. The right ventricle has normal systolic function. The cavity was normal. There is no increase in right ventricular wall thickness.  3. Trivial pericardial effusion is present.  4. No evidence of mitral valve stenosis. No mitral regurgitation.  5. The aortic valve is tricuspid no stenosis of the aortic valve.  6. The aortic root and ascending aorta are normal in size and structure.  7. The IVC was normal in size. No complete TR doppler jet so unable to estimate PA systolic pressure.  Assessment:    Non-ST elevation (NSTEMI) myocardial infarction (HCC) - Plan: CBC, Comprehensive metabolic panel, Lipid Panel With LDL/HDL Ratio  Coronary artery disease of native artery of native heart with stable angina pectoris (HCC)  Claudication in peripheral vascular disease (HCC)  Hypercholesteremia - Plan: Lipid Panel With LDL/HDL Ratio  Recommendation:  She is presently doing well and  has not had any significant chest discomfort or chest pain.  She has not used any sublingual nitroglycerin.  She is tolerating Effient without any bleeding diathesis.  Also tolerating high-dose statins.  She has very mild hyperlipidemia.  She has now quit smoking since myocardial infarction, I have congratulated her.  I like to obtain CBC, CMP along with lipid profile testing in 4 weeks and would like to see her back in 6 weeks for follow-up.  She request that I excuse her from work until the end of this month, in view of her diffuse coronary disease, it would be appropriate to do so.  She will be taken off of work until 06/25/2018 unless new recommendations were to come following this time.  I have again discussed with her regarding social distancing.  Yates Decamp, MD, [facility] 06/03/2018, 2:39 PM [facility] Pager: [phone] Office: [phone] If no answer Cell [phone]

## 2018-06-08 ENCOUNTER — Inpatient Hospital Stay: Payer: Self-pay | Admitting: Critical Care Medicine

## 2018-06-16 ENCOUNTER — Other Ambulatory Visit: Payer: Self-pay | Admitting: Internal Medicine

## 2018-06-17 ENCOUNTER — Other Ambulatory Visit: Payer: Self-pay | Admitting: Internal Medicine

## 2018-06-30 LAB — COMPREHENSIVE METABOLIC PANEL
ALT: 18 IU/L (ref 0–32)
AST: 20 IU/L (ref 0–40)
Albumin/Globulin Ratio: 2.6 — ABNORMAL HIGH (ref 1.2–2.2)
Albumin: 4.7 g/dL (ref 3.8–4.8)
Alkaline Phosphatase: 78 IU/L (ref 39–117)
BUN/Creatinine Ratio: 16 (ref 12–28)
BUN: 17 mg/dL (ref 8–27)
Bilirubin Total: 0.8 mg/dL (ref 0.0–1.2)
CO2: 21 mmol/L (ref 20–29)
Calcium: 9.8 mg/dL (ref 8.7–10.3)
Chloride: 105 mmol/L (ref 96–106)
Creatinine, Ser: 1.06 mg/dL — ABNORMAL HIGH (ref 0.57–1.00)
GFR calc Af Amer: 64 mL/min/{1.73_m2} (ref >59)
GFR calc non Af Amer: 56 mL/min/{1.73_m2} — ABNORMAL LOW (ref >59)
Globulin, Total: 1.8 g/dL (ref 1.5–4.5)
Glucose: 102 mg/dL — ABNORMAL HIGH (ref 65–99)
Potassium: 4.8 mmol/L (ref 3.5–5.2)
Sodium: 141 mmol/L (ref 134–144)
Total Protein: 6.5 g/dL (ref 6.0–8.5)

## 2018-06-30 LAB — LIPID PANEL WITH LDL/HDL RATIO
Cholesterol, Total: 143 mg/dL (ref 100–199)
HDL: 53 mg/dL (ref >39)
LDL Calculated: 58 mg/dL (ref 0–99)
LDl/HDL Ratio: 1.1 ratio (ref 0.0–3.2)
Triglycerides: 159 mg/dL — ABNORMAL HIGH (ref 0–149)
VLDL Cholesterol Cal: 32 mg/dL (ref 5–40)

## 2018-06-30 LAB — CBC
Hematocrit: 42.4 % (ref 34.0–46.6)
Hemoglobin: 14.3 g/dL (ref 11.1–15.9)
MCH: 29.3 pg (ref 26.6–33.0)
MCHC: 33.7 g/dL (ref 31.5–35.7)
MCV: 87 fL (ref 79–97)
Platelets: 161 10*3/uL (ref 150–450)
RBC: 4.88 x10E6/uL (ref 3.77–5.28)
RDW: 13.3 % (ref 11.7–15.4)
WBC: 8.1 10*3/uL (ref 3.4–10.8)

## 2018-07-13 ENCOUNTER — Other Ambulatory Visit: Payer: Self-pay

## 2018-07-13 ENCOUNTER — Ambulatory Visit (INDEPENDENT_AMBULATORY_CARE_PROVIDER_SITE_OTHER): Payer: Self-pay | Admitting: Cardiology

## 2018-07-13 ENCOUNTER — Encounter: Payer: Self-pay | Admitting: Cardiology

## 2018-07-13 ENCOUNTER — Ambulatory Visit: Payer: Self-pay | Admitting: Cardiology

## 2018-07-13 VITALS — BP 128/51 | HR 68 | Temp 97.0°F | Ht 67.5 in | Wt 187.5 lb

## 2018-07-13 DIAGNOSIS — E7800 Pure hypercholesterolemia, unspecified: Secondary | ICD-10-CM

## 2018-07-13 DIAGNOSIS — I10 Essential (primary) hypertension: Secondary | ICD-10-CM

## 2018-07-13 DIAGNOSIS — I25118 Atherosclerotic heart disease of native coronary artery with other forms of angina pectoris: Secondary | ICD-10-CM

## 2018-07-13 DIAGNOSIS — R61 Generalized hyperhidrosis: Secondary | ICD-10-CM

## 2018-07-13 DIAGNOSIS — I739 Peripheral vascular disease, unspecified: Secondary | ICD-10-CM

## 2018-07-13 NOTE — Progress Notes (Signed)
Primary Physician/Referring:  Madolyn Frieze, Etta Quill, MD  Patient ID: Brenda Goodwin, female    DOB: 03-24-1955, 63 y.o.   MRN: [id number]  Chief Complaint  Patient presents with  . Coronary Artery Disease    6 week f/u    HPI: Brenda Goodwin  is a 63 y.o. female  with hypertension, tobacco abuse, hyperlipidemia, COPD by exam during hospitalization on 05/18/2018 when she presented with syncope, she also had abnormal EKG and positive cardiac markers for non-STEMI.  Syncope was felt to be vasovagal due to severe nausea associated with myocardial infarction but arrhythmic etiology could not be completely excluded.  Since hospitalization she has completely quit smoking.  She has been walking in the neighborhood without any significant chest discomfort, has had occasional chest discomfort lasting just less than a minute and is easily relieved.  She has not used any sublingual nitroglycerin. Denies any dyspnea on exertion. She does notice since being on the medications that she is having a daily headache different than her previous migraines. She also has night sweats at night, states different than her previous hot flashes. Episodes last 45 minutes before able to go to sleep. No palpitations. She also notices that she has nausea when she gets really hot. Symptoms did not start until she was started on the medications. Overall she feels well. No recurrent syncope.   She does have pain in her legs, L>R, both at rest or with exertion. No ulcerations. She continues to be abstinent from tobacco use. She has been monitoring her BP at home and generally in the 120's systolic and 60's diastolic.   Past Medical History:  Diagnosis Date  . Claudication in peripheral vascular disease (HCC) 06/02/2018  . Dyspnea on exertion 06/02/2018  . Hypertension   . Obstructive chronic bronchitis without exacerbation (HCC) 06/02/2018    Past Surgical History:  Procedure Laterality Date  . ABDOMINAL HYSTERECTOMY    . LEFT HEART  CATH AND CORONARY ANGIOGRAPHY N/A 05/20/2018   Procedure: LEFT HEART CATH AND CORONARY ANGIOGRAPHY;  Surgeon: Yates Decamp, MD;  Location: [facility];  Service: Cardiovascular;  Laterality: N/A;    Social History   Socioeconomic History  . Marital status: Married    Spouse name: Not on file  . Number of children: 2  . Years of education: Not on file  . Highest education level: Not on file  Occupational History  . Not on file  Social Needs  . Financial resource strain: Not on file  . Food insecurity:    Worry: Not on file    Inability: Not on file  . Transportation needs:    Medical: Not on file    Non-medical: Not on file  Tobacco Use  . Smoking status: Former Smoker    Packs/day: 1.00    Types: Cigarettes    Last attempt to quit: 05/18/2018    Years since quitting: 0.1  . Smokeless tobacco: Never Used  Substance and Sexual Activity  . Alcohol use: Not Currently  . Drug use: Never  . Sexual activity: Not on file  Lifestyle  . Physical activity:    Days per week: Not on file    Minutes per session: Not on file  . Stress: Not on file  Relationships  . Social connections:    Talks on phone: Not on file    Gets together: Not on file    Attends religious service: Not on file    Active member of club or organization:  Not on file    Attends meetings of clubs or organizations: Not on file    Relationship status: Not on file  . Intimate partner violence:    Fear of current or ex partner: Not on file    Emotionally abused: Not on file    Physically abused: Not on file    Forced sexual activity: Not on file  Other Topics Concern  . Not on file  Social History Narrative  . Not on file    Current Outpatient Medications on File Prior to Visit  Medication Sig Dispense Refill  . ALPRAZolam (XANAX) 1 MG tablet as needed.    Marland Kitchen aspirin EC 81 MG EC tablet Take 1 tablet (81 mg total) by mouth daily. 30 tablet 0  . atorvastatin (LIPITOR) 80 MG tablet Take 1 tablet (80 mg  total) by mouth daily at 6 PM. 30 tablet 0  . buPROPion (WELLBUTRIN) 75 MG tablet Take 75 mg by mouth daily.    . cholecalciferol (VITAMIN D3) 25 MCG (1000 UT) tablet Take 5,000 Units by mouth daily.     . Cyanocobalamin (VITAMIN B-12) 5000 MCG SUBL Place under the tongue daily.    Marland Kitchen loratadine (CLARITIN) 10 MG tablet Take 10 mg by mouth daily.    . metoprolol succinate (TOPROL-XL) 25 MG 24 hr tablet Take 1 tablet (25 mg total) by mouth daily. 30 tablet 0  . omeprazole (PRILOSEC) 20 MG capsule Take 20 mg by mouth daily.    . prasugrel (EFFIENT) 10 MG TABS tablet Take 1 tablet (10 mg total) by mouth daily. 30 tablet 0  . Tiotropium Bromide Monohydrate (SPIRIVA RESPIMAT) 2.5 MCG/ACT AERS Inhale 2 puffs into the lungs daily. 1 Inhaler 0   No current facility-administered medications on file prior to visit.     Review of Systems  Constitution: Negative for chills, decreased appetite, malaise/fatigue and weight gain.  Cardiovascular: Positive for chest pain (occasional and not used NTG), claudication and dyspnea on exertion (stable). Negative for leg swelling, palpitations and syncope.  Respiratory: Negative for sputum production and wheezing.   Endocrine: Positive for heat intolerance. Negative for cold intolerance.       Night sweats  Hematologic/Lymphatic: Does not bruise/bleed easily.  Musculoskeletal: Positive for muscle cramps (pain in legs L>R). Negative for joint swelling.  Gastrointestinal: Negative for abdominal pain, anorexia and change in bowel habit.  Neurological: Positive for headaches. Negative for light-headedness.  Psychiatric/Behavioral: Negative for depression and substance abuse.  All other systems reviewed and are negative.     Objective  Blood pressure (!) 128/51, pulse 68, temperature (!) 97 F (36.1 C), height 5' 7.5" (1.715 m), weight 187 lb 8 oz (85 kg), SpO2 99 %. Body mass index is 28.93 kg/m.    Physical Exam  Constitutional: She is oriented to person, place,  and time. Vital signs are normal. She appears well-developed and well-nourished.  HENT:  Head: Normocephalic and atraumatic.  Neck: Normal range of motion.  Cardiovascular: Normal rate, regular rhythm, normal heart sounds and intact distal pulses.  Pulmonary/Chest: Effort normal and breath sounds normal. No accessory muscle usage. No respiratory distress.  Abdominal: Soft. Bowel sounds are normal.  Musculoskeletal: Normal range of motion.  Neurological: She is alert and oriented to person, place, and time.  Skin: Skin is warm and dry.  Vitals reviewed.  Radiology: No results found.  Laboratory examination:    CMP Latest Ref Rng & Units 06/29/2018 05/19/2018 05/18/2018  Glucose 65 - 99 mg/dL 628(B) 151(V) 616(W)  BUN 8 - 27 mg/dL 17 9 12   Creatinine 0.57 - 1.00 mg/dL 1.61(W1.06(H) 9.60(A1.02(H) 5.40(J1.20(H)  Sodium 134 - 144 mmol/L 141 141 137  Potassium 3.5 - 5.2 mmol/L 4.8 3.8 3.2(L)  Chloride 96 - 106 mmol/L 105 107 104  CO2 20 - 29 mmol/L 21 24 18(L)  Calcium 8.7 - 10.3 mg/dL 9.8 8.9 9.1  Total Protein 6.0 - 8.5 g/dL 6.5 8.1(X5.6(L) 6.5  Total Bilirubin 0.0 - 1.2 mg/dL 0.8 0.8 9.1(Y1.6(H)  Alkaline Phos 39 - 117 IU/L 78 60 76  AST 0 - 40 IU/L 20 17 32  ALT 0 - 32 IU/L 18 11 16    CBC Latest Ref Rng & Units 06/29/2018 05/19/2018 05/19/2018  WBC 3.4 - 10.8 x10E3/uL 8.1 9.4 8.3  Hemoglobin 11.1 - 15.9 g/dL 78.214.3 95.612.8 11.8(L)  Hematocrit 34.0 - 46.6 % 42.4 39.2 37.1  Platelets 150 - 450 x10E3/uL 161 204 180   Lipid Panel     Component Value Date/Time   CHOL 143 06/29/2018 0828   TRIG 159 (H) 06/29/2018 0828   HDL 53 06/29/2018 0828   CHOLHDL 4.9 05/18/2018 1106   VLDL 22 05/18/2018 1106   LDLCALC 58 06/29/2018 0828   HEMOGLOBIN A1C Lab Results  Component Value Date   HGBA1C 5.9 (H) 05/18/2018   MPG 122.63 05/18/2018   TSH Recent Labs    05/18/18 1106  TSH 1.665    Cardiac Studies:   Coronary angiogram 05/20/2018: Right dominant circulation, mild disease in the proximal and mid segment of  the right coronary artery, PDA has a focal 50 to 60% stenosis. Large vessel. Left main is normal, LAD is a large-caliber vessel in the proximal segment but after the origin of D1 which is large, there is moderate diffuse disease constituting 50 to 60% stenosis at the bifurcation of large D2 which is LAD equivalent from the mid segment. D2 has proximal 90% stenosis followed by a distal 90% stenosis. Diffuse plaque burden noted. Circumflex coronary artery gives origin to very small marginals, proximal segment has a mildly ulcerated 50 to 60% stenosis appears to be stable. Left ventricle systolic function is normal with normal LVEDP.  Echocardiogram 05/18/2018 :  1. The left ventricle has normal systolic function, with an ejection fraction of 55-60%. The cavity size was normal. Left ventricular diastolic Doppler parameters are consistent with pseudonormalization. No evidence of left ventricular regional wall  motion abnormalities. 2. The right ventricle has normal systolic function. The cavity was normal. There is no increase in right ventricular wall thickness. 3. Trivial pericardial effusion is present. 4. No evidence of mitral valve stenosis. No mitral regurgitation. 5. The aortic valve is tricuspid no stenosis of the aortic valve. 6. The aortic root and ascending aorta are normal in size and structure. 7. The IVC was normal in size. No complete TR doppler jet so unable to estimate PA systolic pressure.  Assessment   Coronary artery disease of native artery of native heart with stable angina pectoris [facility](HCC)  Essential hypertension  Hypercholesteremia  Night sweats  Peripheral vascular disease with claudication [facility](HCC)    Recommendations:   Patient is here for follow-up.  She has not had any recurrent syncope.  Has occasional twinges of chest pain, has not had to use any nitroglycerin.  She has multiple complaints since being on the medications including intolerance to heat,  night sweats, headaches, and pain in her legs both at rest and with walking.  I have reviewed recently obtained labs, lipids are well controlled.  CBC and CMP are stable.  Hemoglobin A1c is noted to be 5.9%, suggestive of prediabetes.  Unsure of etiology for her symptoms, potentially intolerance to heat and night sweats related to metoprolol.  We will try taking half a tablet for the next 1 week to see if her symptoms improve.  Could also be related to hormonal changes.  Has had normal thyroid function.  She has not had any symptoms of angina.  We will continue with Effient and risk factor reduction.  Unfortunately has been gaining weight since tobacco cessation.  Urged her to continue towards diet modifications and regular exercise to help with this.  She does have slightly abnormal vascular exam and symptoms consistent with claudication.  No evidence of ischemic limb.  I would like to obtain lower extremity arterial duplex to establish a baseline; however, patient is without insurance and cost would be an issue.  We will hold off on ordering this at this time as she has been out of work since her hospital admission, but may consider in the near future.  Feel that patient may return to work without any restrictions and will provide work letter stating this.  She will continue to need to follow social distancing and wearing a mask and gloves while at work.  We will see her back in 2 months for follow-up, but encouraged her to contact me for any new or worsening problems.  Toniann Fail, MSN, APRN, FNP-C [facility] Office: [phone] Fax: [phone]

## 2018-07-14 ENCOUNTER — Encounter: Payer: Self-pay | Admitting: Cardiology

## 2018-09-17 ENCOUNTER — Ambulatory Visit: Payer: Self-pay | Admitting: Cardiology

## 2020-02-22 IMAGING — CT CT CHEST WITHOUT CONTRAST
2 of 3 series · 15 of 36 positions shown, 18 images · non-contrast
Comparison: None.

CLINICAL DATA: Cough, ongoing since [REDACTED]. Syncopal episode this
morning.

EXAM:
CT CHEST WITHOUT CONTRAST
TECHNIQUE: Multidetector CT imaging of the chest was performed following the
standard protocol without IV contrast.

[Series 3: chest w/o 2mm st · axial · non-contrast · 0.69mm/px · z∈[+1244,+1516]mm · 12 of 160 slices shown, 15 images]
[im 12/160  mediastinal]
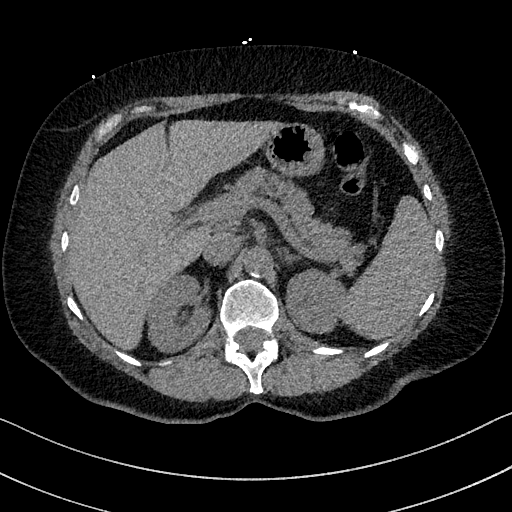
[im 12/160  lung]
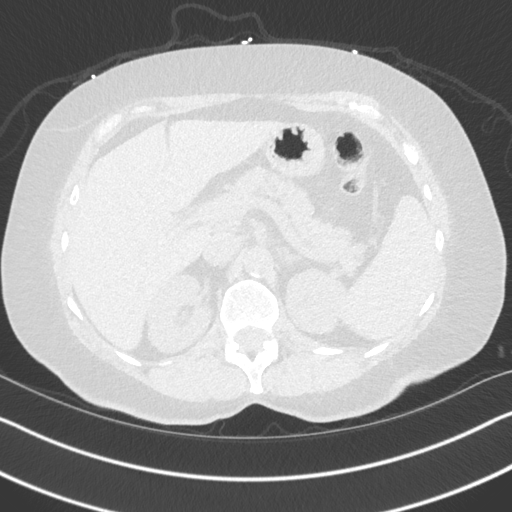
[im 24/160  lung]
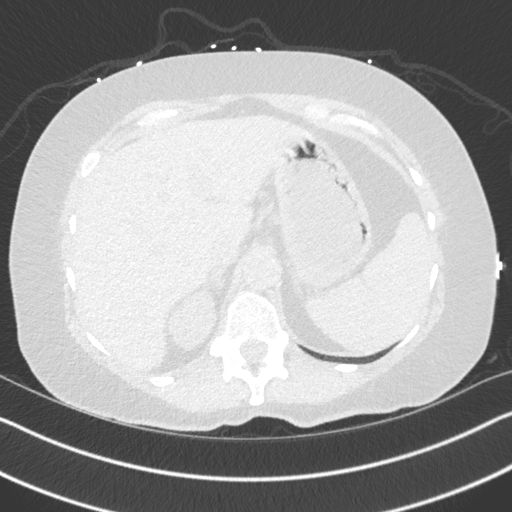
[im 36/160  lung]
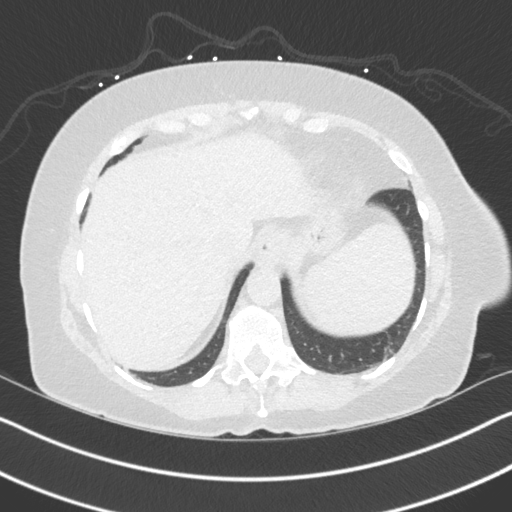
[im 48/160  lung]
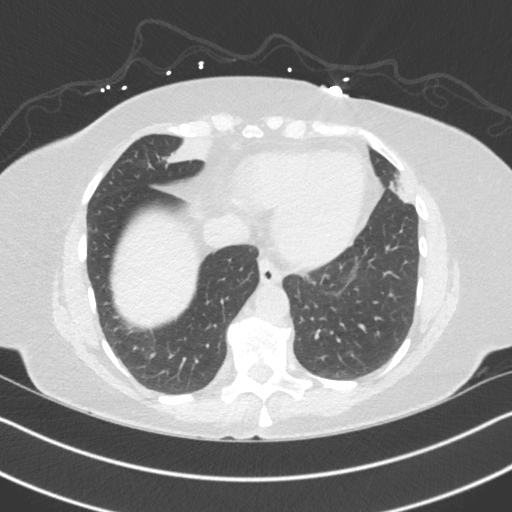
[im 59/160  mediastinal]
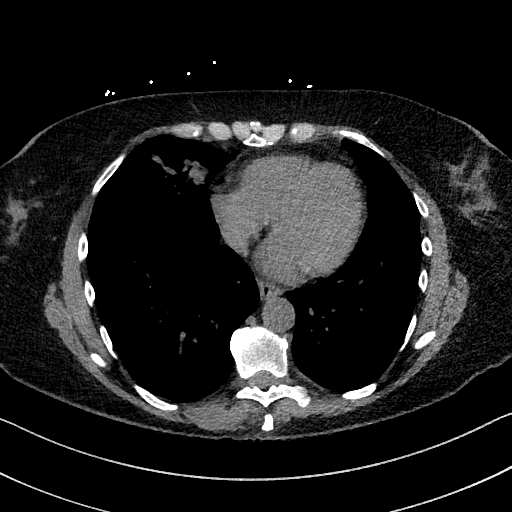
[im 59/160  lung]
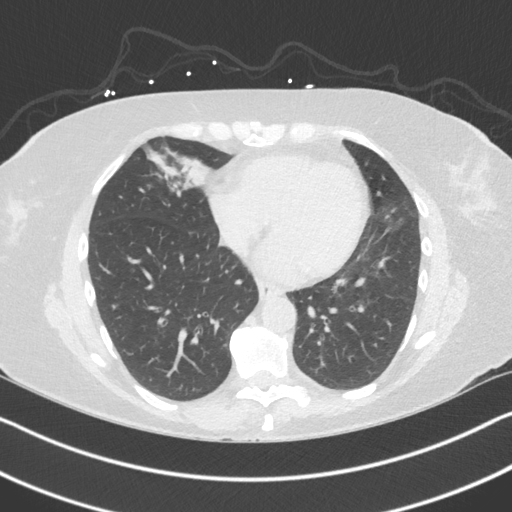
[im 71/160  lung]
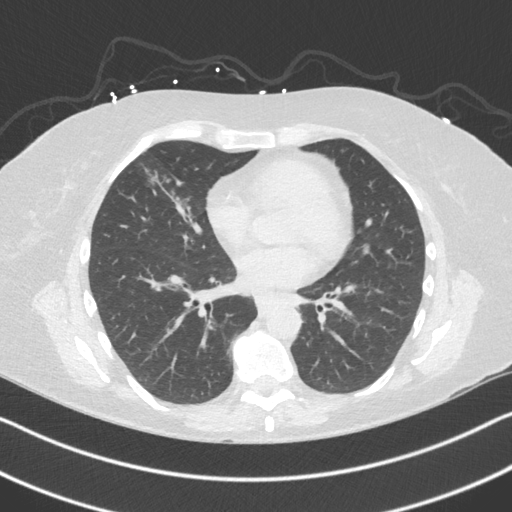
[im 89/160  lung]
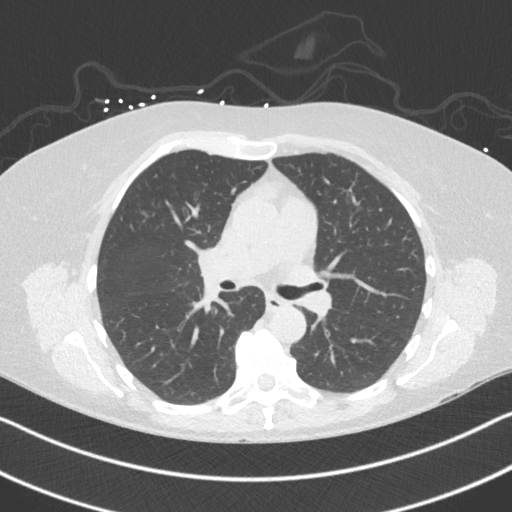
[im 101/160  lung]
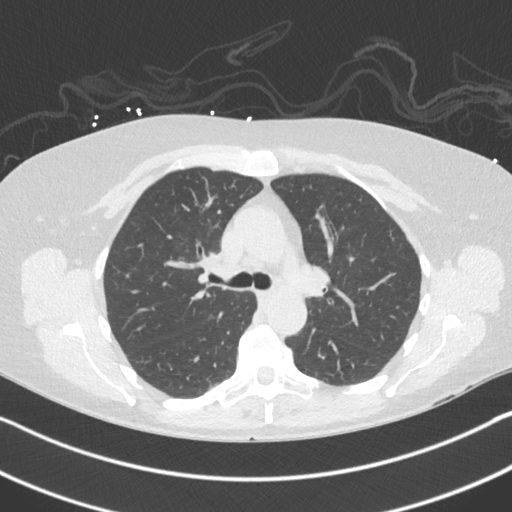
[im 112/160  mediastinal]
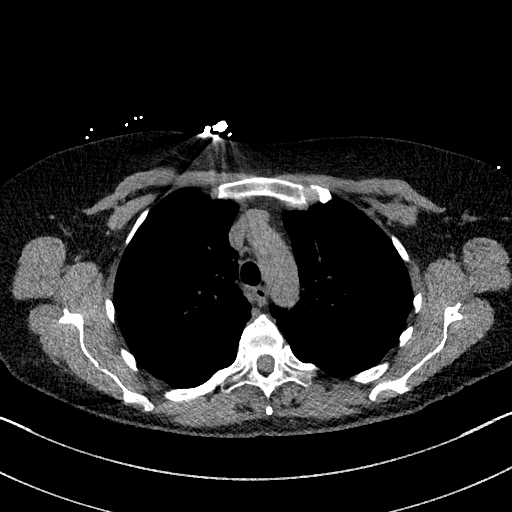
[im 112/160  lung]
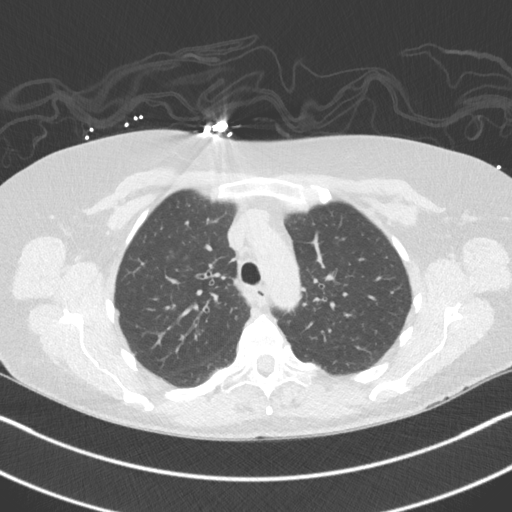
[im 124/160  lung]
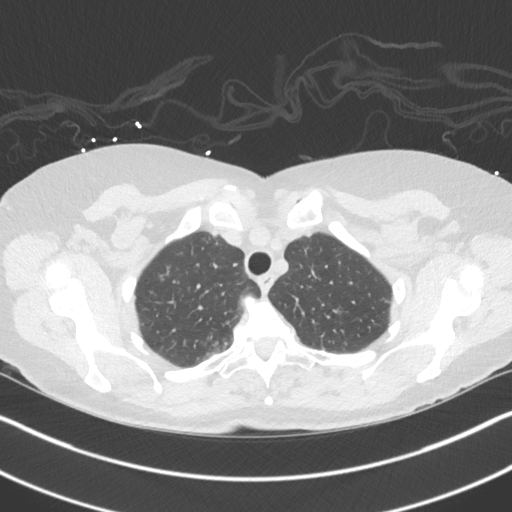
[im 136/160  lung]
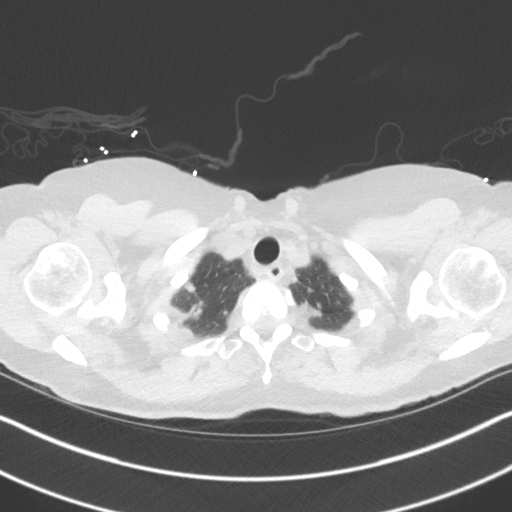
[im 148/160  lung]
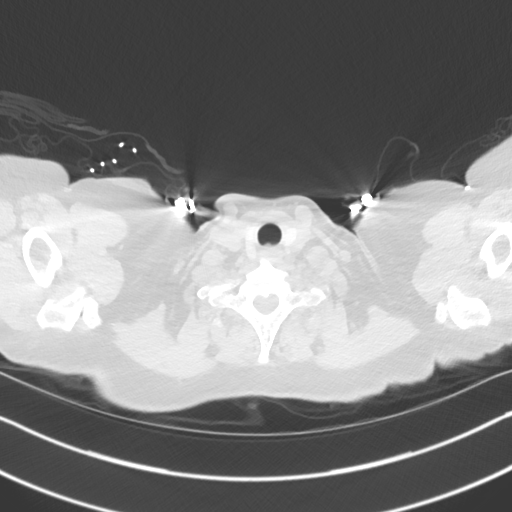

[Series 6: chest w/o 3mm st cor · coronal · non-contrast · 0.65mm/px · 3 of 86 slices shown]
[im 18/86  lung]
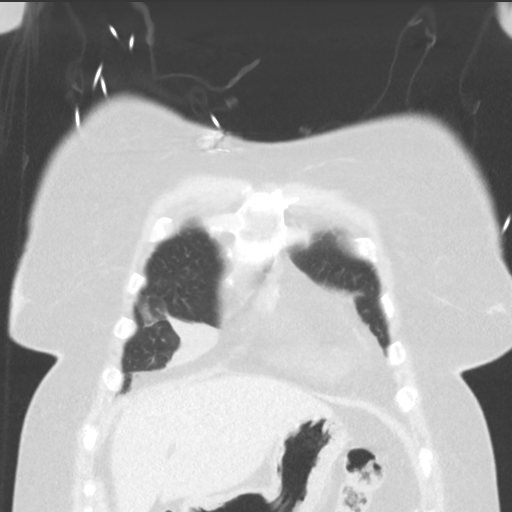
[im 35/86  lung]
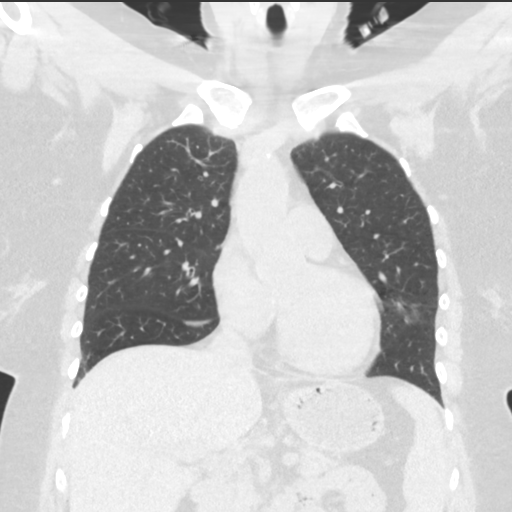
[im 52/86  lung]
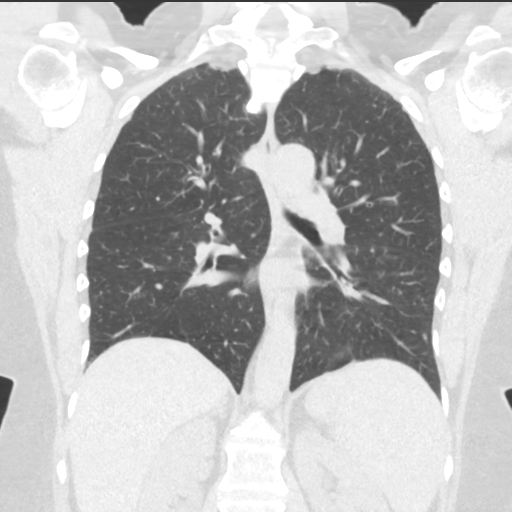

[15 of 36 positions shown; findings below may reference images not displayed]

FINDINGS: Cardiovascular: No significant vascular findings. Normal heart size.
No pericardial effusion.

Mediastinum/Nodes: No enlarged mediastinal or axillary lymph nodes.
Thyroid gland, trachea, and esophagus demonstrate no significant
findings. Rounded area of fluid density in the anterior mediastinum
likely reflecting superior extent of the superior pericardial
recess.

Lungs/Pleura: Biapical scarring. Small area of reticulonodular
interstitial disease in the right middle lobe likely secondary to an
infectious or inflammatory etiology including atypical infection.
Adjacent right middle lobe scarring. Mild lingular scarring. No
pleural effusion or pneumothorax.

Upper Abdomen: No acute abnormality.

Musculoskeletal: No acute osseous abnormality. No aggressive osseous
lesion.
IMPRESSION: 1. Small area of reticulonodular interstitial disease in the right
middle lobe likely secondary to an infectious or inflammatory
etiology including atypical infection. Scarring in the right middle
lobe and lingula.

## 2020-02-22 IMAGING — CR CHEST - 2 VIEW
2 series · 2 of 2 positions shown · non-contrast
Comparison: 09/01/2007

CLINICAL DATA: Productive cough for 2 months

EXAM:
CHEST - 2 VIEW

[chest pa]
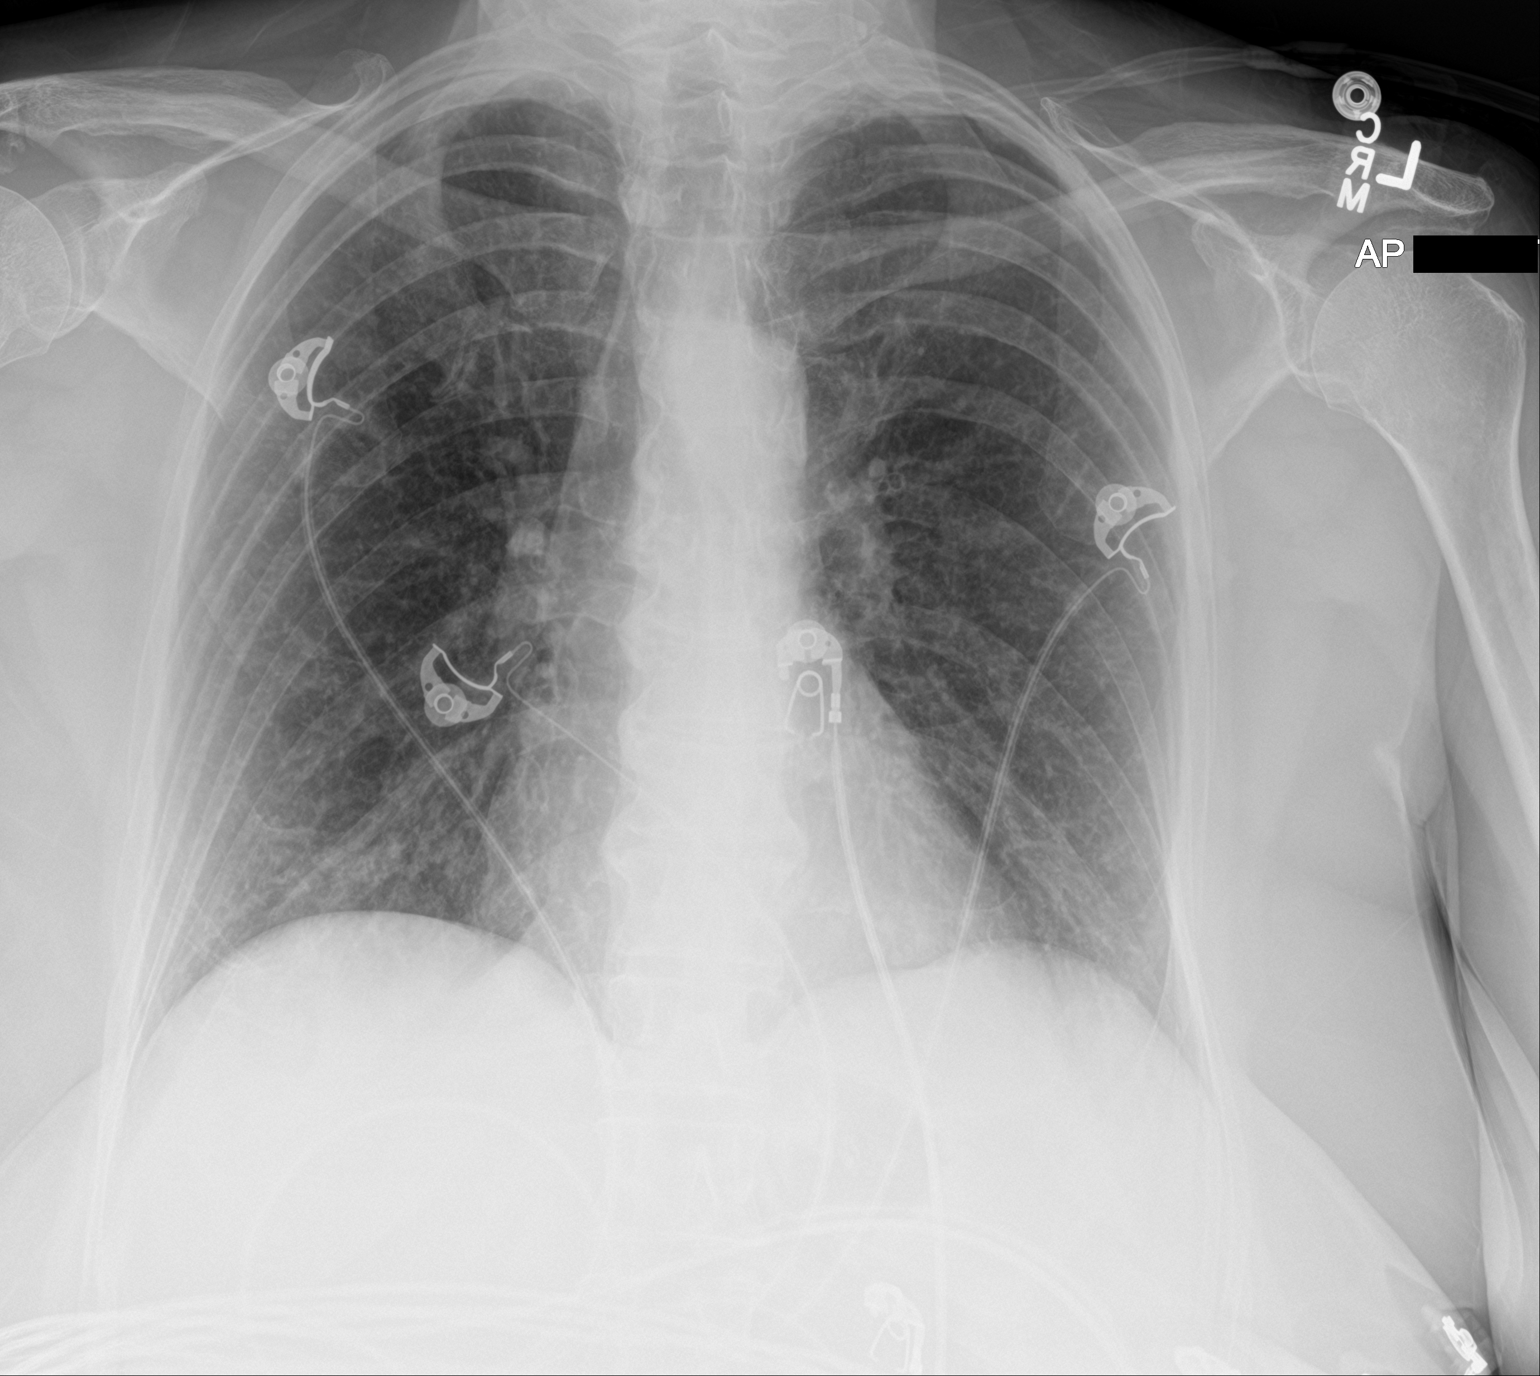

[chest lat]
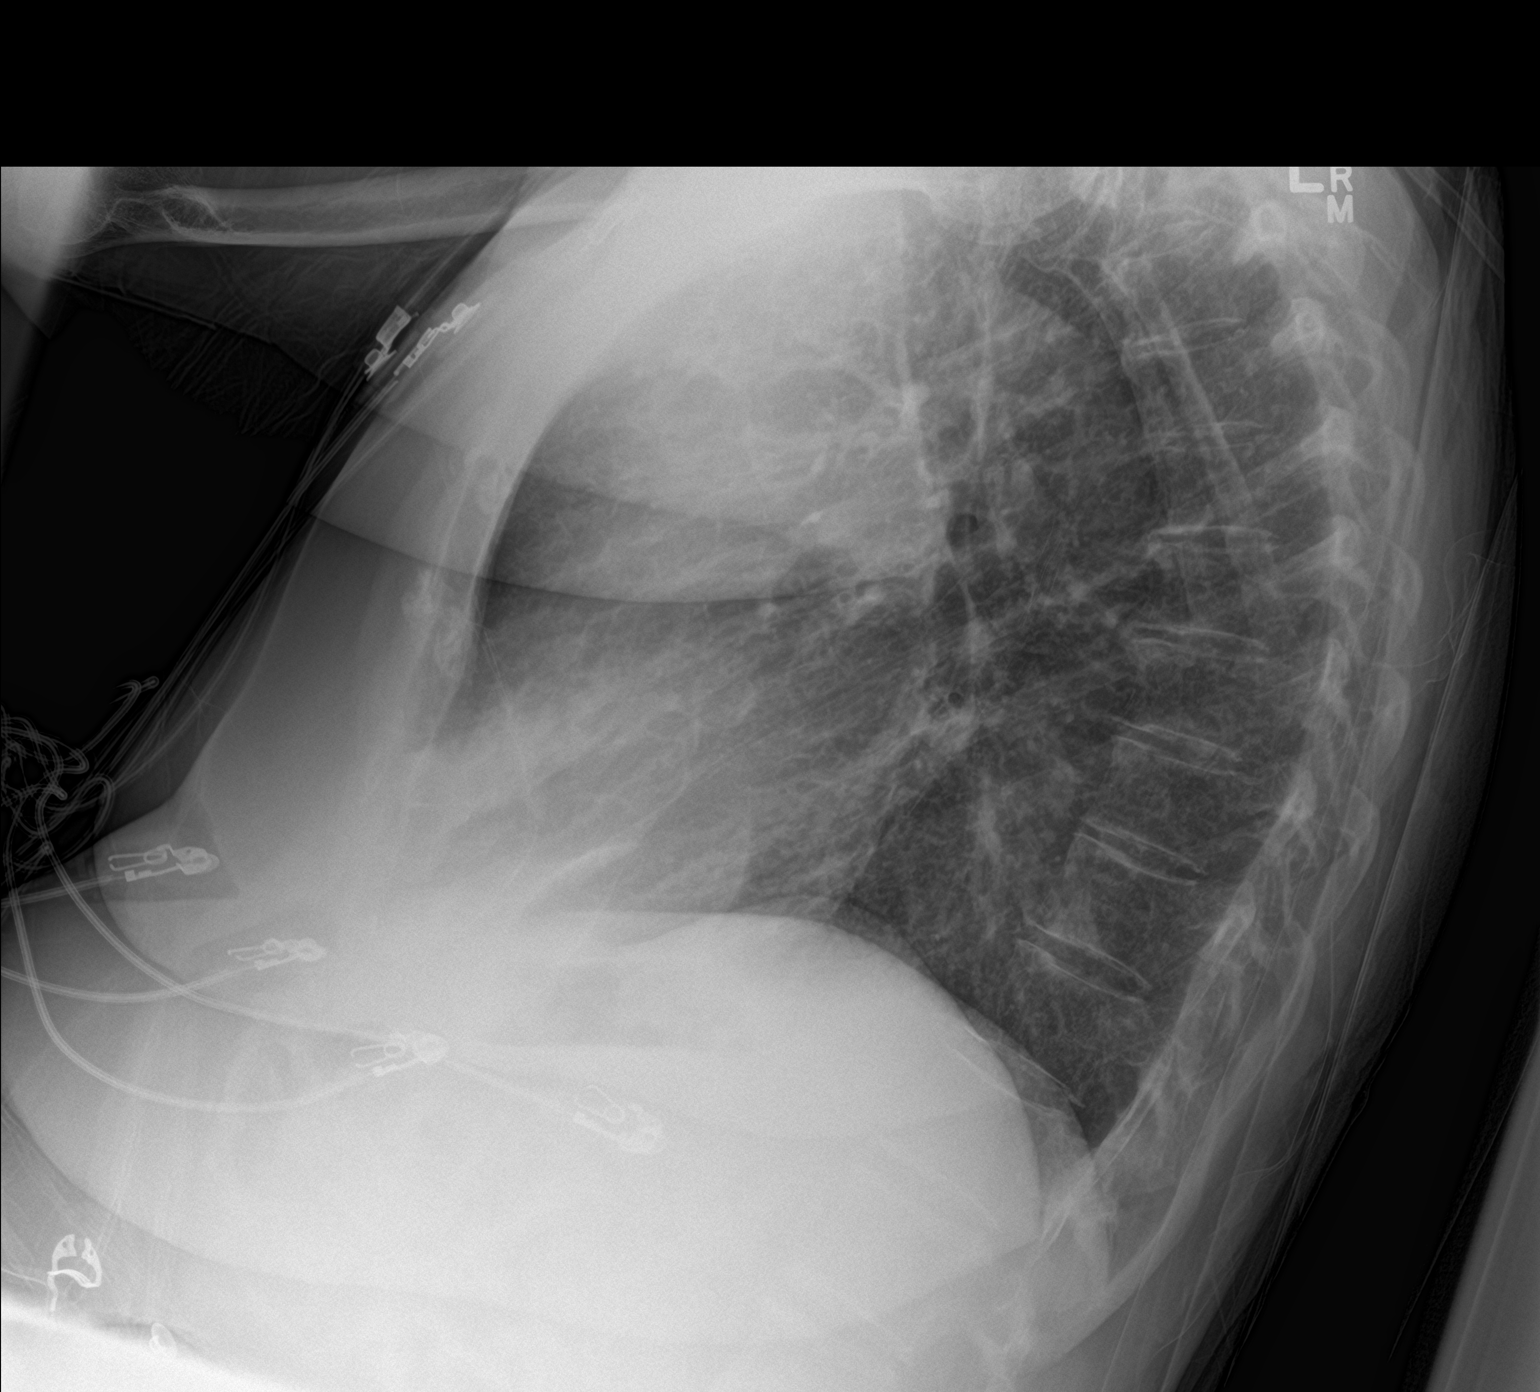

[2 of 2 positions shown; findings below may reference images not displayed]

FINDINGS: Cardiac shadow is within normal limits. The lungs are well aerated
bilaterally. Patchy right middle lobe opacity is noted. No sizable
effusion is seen. No bony abnormality is noted.
IMPRESSION: Right middle lobe mild infiltrate.
